# Patient Record
Sex: Male | Born: 1990
Health system: Southern US, Community
[De-identification: ages and names within clinical notes are randomized; demographics above are authoritative.]

## PROBLEM LIST (undated history)

## (undated) DIAGNOSIS — J45909 Unspecified asthma, uncomplicated: Secondary | ICD-10-CM

## (undated) DIAGNOSIS — D689 Coagulation defect, unspecified: Secondary | ICD-10-CM

## (undated) DIAGNOSIS — Z803 Family history of malignant neoplasm of breast: Secondary | ICD-10-CM

## (undated) DIAGNOSIS — D649 Anemia, unspecified: Secondary | ICD-10-CM

## (undated) DIAGNOSIS — T7840XA Allergy, unspecified, initial encounter: Secondary | ICD-10-CM

## (undated) HISTORY — PX: FRACTURE SURGERY: SHX138

## (undated) HISTORY — DX: Family history of malignant neoplasm of breast: Z80.3

## (undated) HISTORY — PX: ORIF FINGER / THUMB FRACTURE: SUR932

## (undated) HISTORY — DX: Allergy, unspecified, initial encounter: T78.40XA

## (undated) HISTORY — PX: COLON SURGERY: SHX602

## (undated) HISTORY — DX: Coagulation defect, unspecified: D68.9

## (undated) HISTORY — PX: VASECTOMY: SHX75

---
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
## 2019-08-15 DIAGNOSIS — M7701 Medial epicondylitis, right elbow: Secondary | ICD-10-CM | POA: Diagnosis not present

## 2019-11-16 ENCOUNTER — Other Ambulatory Visit: Payer: Self-pay

## 2019-11-16 ENCOUNTER — Ambulatory Visit: Payer: BLUE CROSS/BLUE SHIELD | Attending: Internal Medicine

## 2019-11-16 DIAGNOSIS — Z23 Encounter for immunization: Secondary | ICD-10-CM

## 2019-11-26 DIAGNOSIS — Z7189 Other specified counseling: Secondary | ICD-10-CM | POA: Diagnosis not present

## 2019-11-26 DIAGNOSIS — Z20828 Contact with and (suspected) exposure to other viral communicable diseases: Secondary | ICD-10-CM | POA: Diagnosis not present

## 2019-12-10 ENCOUNTER — Ambulatory Visit: Payer: BLUE CROSS/BLUE SHIELD | Attending: Emergency Medicine

## 2019-12-10 DIAGNOSIS — Z23 Encounter for immunization: Secondary | ICD-10-CM

## 2019-12-10 NOTE — Progress Notes (Signed)
Covid-19 Vaccination Clinic  Name:  Nathaniel Pratt    MRN: [id number] DOB: 10/22/1990  12/10/2019  Nathaniel Pratt was observed post Covid-19 immunization for 15 minutes without incident. He was provided with Vaccine Information Sheet and instruction to access the V-Safe system.   Nathaniel Pratt was instructed to call 911 with any severe reactions post vaccine: Marland Kitchen Difficulty breathing  . Swelling of face and throat  . A fast heartbeat  . A bad rash all over body  . Dizziness and weakness   Immunizations Administered    Name Date Dose VIS Date Route   Pfizer COVID-19 Vaccine 12/10/2019 11:38 AM 0.3 mL 08/16/2019 Intramuscular   Manufacturer: [place]   Lot: [id number]   [place]: [id number]

## 2020-12-09 ENCOUNTER — Encounter: Payer: Self-pay | Admitting: Internal Medicine

## 2020-12-09 ENCOUNTER — Ambulatory Visit (INDEPENDENT_AMBULATORY_CARE_PROVIDER_SITE_OTHER): Payer: BC Managed Care – PPO | Admitting: Internal Medicine

## 2020-12-09 ENCOUNTER — Other Ambulatory Visit: Payer: Self-pay

## 2020-12-09 VITALS — BP 124/72 | HR 75 | Temp 98.0°F | Ht 72.0 in | Wt 181.4 lb

## 2020-12-09 DIAGNOSIS — Z0000 Encounter for general adult medical examination without abnormal findings: Secondary | ICD-10-CM | POA: Diagnosis not present

## 2020-12-09 DIAGNOSIS — J3089 Other allergic rhinitis: Secondary | ICD-10-CM | POA: Insufficient documentation

## 2020-12-09 NOTE — Patient Instructions (Signed)
Health Maintenance, Male Adopting a healthy lifestyle and getting preventive care are important in promoting health and wellness. Ask your health care provider about:  The right schedule for you to have regular tests and exams.  Things you can do on your own to prevent diseases and keep yourself healthy. What should I know about diet, weight, and exercise? Eat a healthy diet  Eat a diet that includes plenty of vegetables, fruits, low-fat dairy products, and lean protein.  Do not eat a lot of foods that are high in solid fats, added sugars, or sodium.   Maintain a healthy weight Body mass index (BMI) is a measurement that can be used to identify possible weight problems. It estimates body fat based on height and weight. Your health care provider can help determine your BMI and help you achieve or maintain a healthy weight. Get regular exercise Get regular exercise. This is one of the most important things you can do for your health. Most adults should:  Exercise for at least 150 minutes each week. The exercise should increase your heart rate and make you sweat (moderate-intensity exercise).  Do strengthening exercises at least twice a week. This is in addition to the moderate-intensity exercise.  Spend less time sitting. Even light physical activity can be beneficial. Watch cholesterol and blood lipids Have your blood tested for lipids and cholesterol at 30 years of age, then have this test every 5 years. You may need to have your cholesterol levels checked more often if:  Your lipid or cholesterol levels are high.  You are older than 30 years of age.  You are at high risk for heart disease. What should I know about cancer screening? Many types of cancers can be detected early and may often be prevented. Depending on your health history and family history, you may need to have cancer screening at various ages. This may include screening for:  Colorectal cancer.  Prostate  cancer.  Skin cancer.  Lung cancer. What should I know about heart disease, diabetes, and high blood pressure? Blood pressure and heart disease  High blood pressure causes heart disease and increases the risk of stroke. This is more likely to develop in people who have high blood pressure readings, are of African descent, or are overweight.  Talk with your health care provider about your target blood pressure readings.  Have your blood pressure checked: ? Every 3-5 years if you are 8-53 years of age. ? Every year if you are 55 years old or older.  If you are between the ages of 75 and 45 and are a current or former smoker, ask your health care provider if you should have a one-time screening for abdominal aortic aneurysm (AAA). Diabetes Have regular diabetes screenings. This checks your fasting blood sugar level. Have the screening done:  Once every three years after age 71 if you are at a normal weight and have a low risk for diabetes.  More often and at a younger age if you are overweight or have a high risk for diabetes. What should I know about preventing infection? Hepatitis B If you have a higher risk for hepatitis B, you should be screened for this virus. Talk with your health care provider to find out if you are at risk for hepatitis B infection. Hepatitis C Blood testing is recommended for:  Everyone born from [birth year] through 1965.  Anyone with known risk factors for hepatitis C. Sexually transmitted infections (STIs)  You should be screened each  year for STIs, including gonorrhea and chlamydia, if: ? You are sexually active and are younger than 30 years of age. ? You are older than 30 years of age and your health care provider tells you that you are at risk for this type of infection. ? Your sexual activity has changed since you were last screened, and you are at increased risk for chlamydia or gonorrhea. Ask your health care provider if you are at risk.  Ask your  health care provider about whether you are at high risk for HIV. Your health care provider may recommend a prescription medicine to help prevent HIV infection. If you choose to take medicine to prevent HIV, you should first get tested for HIV. You should then be tested every 3 months for as long as you are taking the medicine. Follow these instructions at home: Lifestyle  Do not use any products that contain nicotine or tobacco, such as cigarettes, e-cigarettes, and chewing tobacco. If you need help quitting, ask your health care provider.  Do not use street drugs.  Do not share needles.  Ask your health care provider for help if you need support or information about quitting drugs. Alcohol use  Do not drink alcohol if your health care provider tells you not to drink.  If you drink alcohol: ? Limit how much you have to 0-2 drinks a day. ? Be aware of how much alcohol is in your drink. In the [place] one drink equals one 12 oz bottle of beer (355 mL), one 5 oz glass of wine (148 mL), or one 1 oz glass of hard liquor (44 mL). General instructions  Schedule regular health, dental, and eye exams.  Stay current with your vaccines.  Tell your health care provider if: ? You often feel depressed. ? You have ever been abused or do not feel safe at home. Summary  Adopting a healthy lifestyle and getting preventive care are important in promoting health and wellness.  Follow your health care provider's instructions about healthy diet, exercising, and getting tested or screened for diseases.  Follow your health care provider's instructions on monitoring your cholesterol and blood pressure. This information is not intended to replace advice given to you by your health care provider. Make sure you discuss any questions you have with your health care provider. Document Revised: 08/15/2018 Document Reviewed: 08/15/2018 Elsevier Patient Education  2021 [employer].

## 2020-12-09 NOTE — Progress Notes (Signed)
Date:  12/09/2020   Name:  Nathaniel Pratt   DOB:  [birth year]   MRN:  [id number]   Chief Complaint: New Patient (Initial Visit) and Establish Care  Aj Crunkleton is a 30 y.o. male who presents today for his Complete Annual Exam. He feels well. He reports exercising -5 days a week weights/with cardio. He reports he is sleeping fairly well. He has allergies in the spring - take Zyrtec and uses an inhaler rarely.  No hx of asthma.  No wheezing with exercise or cold exposure.  Colonoscopy: not due  Immunization History  Administered Date(s) Administered  . PFIZER(Purple Top)SARS-COV-2 Vaccination 11/16/2019, 12/10/2019    HPI  No results found for: CREATININE, BUN, NA, K, CL, CO2 No results found for: CHOL, HDL, LDLCALC, LDLDIRECT, TRIG, CHOLHDL No results found for: TSH No results found for: HGBA1C No results found for: WBC, HGB, HCT, MCV, PLT No results found for: ALT, AST, GGT, ALKPHOS, BILITOT   Review of Systems  Constitutional: Negative for appetite change, chills, diaphoresis, fatigue and unexpected weight change.  HENT: Negative for hearing loss, tinnitus, trouble swallowing and voice change.   Eyes: Negative for visual disturbance.  Respiratory: Negative for choking, shortness of breath and wheezing.   Cardiovascular: Negative for chest pain, palpitations and leg swelling.  Gastrointestinal: Negative for abdominal pain, blood in stool, constipation and diarrhea.  Genitourinary: Negative for difficulty urinating, dysuria and frequency.  Musculoskeletal: Positive for back pain (intermittent left sided sciatica). Negative for arthralgias and myalgias.  Skin: Negative for color change and rash.  Neurological: Negative for dizziness, syncope and headaches.  Hematological: Negative for adenopathy.  Psychiatric/Behavioral: Negative for dysphoric mood and sleep disturbance.    Patient Active Problem List   Diagnosis Date Noted  . Environmental and seasonal allergies  12/09/2020    Allergies  Allergen Reactions  . Amoxicillin Hives  . Penicillins Hives and Itching    Past Surgical History:  Procedure Laterality Date  . ORIF FINGER / THUMB FRACTURE    . RHINOPLASTY  2008    Social History   Tobacco Use  . Smoking status: Never Smoker  . Smokeless tobacco: Never Used  Vaping Use  . Vaping Use: Never used  Substance Use Topics  . Alcohol use: Not Currently  . Drug use: Never     Medication list has been reviewed and updated.  Current Meds  Medication Sig  . Albuterol Sulfate (VENTOLIN HFA IN) Inhale 1 Inhaler into the lungs as needed.  . cetirizine (ZYRTEC) 10 MG tablet Take 10 mg by mouth daily.    PHQ 2/9 Scores 12/09/2020  PHQ - 2 Score 0  PHQ- 9 Score 0    GAD 7 : Generalized Anxiety Score 12/09/2020  Nervous, Anxious, on Edge 0  Control/stop worrying 0  Worry too much - different things 0  Trouble relaxing 0  Restless 0  Easily annoyed or irritable 0  Afraid - awful might happen 0  Total GAD 7 Score 0    BP Readings from Last 3 Encounters:  12/09/20 124/72    Physical Exam Vitals and nursing note reviewed.  Constitutional:      Appearance: Normal appearance. He is well-developed.  HENT:     Head: Normocephalic.     Right Ear: Tympanic membrane, ear canal and external ear normal.     Left Ear: Tympanic membrane, ear canal and external ear normal.     Nose: Nose normal.  Eyes:     Conjunctiva/sclera: Conjunctivae  normal.     Pupils: Pupils are equal, round, and reactive to light.  Neck:     Thyroid: No thyromegaly.     Vascular: No carotid bruit.  Cardiovascular:     Rate and Rhythm: Normal rate and regular rhythm.     Pulses: Normal pulses.     Heart sounds: Normal heart sounds.  Pulmonary:     Effort: Pulmonary effort is normal.     Breath sounds: Normal breath sounds. No wheezing.  Chest:  Breasts:     Right: No mass.     Left: No mass.    Abdominal:     General: Abdomen is flat. Bowel sounds are  normal.     Palpations: Abdomen is soft. There is no mass.     Tenderness: There is no abdominal tenderness. There is no guarding or rebound.  Musculoskeletal:        General: Normal range of motion.     Cervical back: Normal range of motion and neck supple.     Right lower leg: No edema.     Left lower leg: No edema.  Lymphadenopathy:     Cervical: No cervical adenopathy.  Skin:    General: Skin is warm and dry.     Capillary Refill: Capillary refill takes less than 2 seconds.  Neurological:     General: No focal deficit present.     Mental Status: He is alert and oriented to person, place, and time.     Deep Tendon Reflexes: Reflexes are normal and symmetric.  Psychiatric:        Attention and Perception: Attention normal.        Mood and Affect: Mood normal.        Thought Content: Thought content normal.     Wt Readings from Last 3 Encounters:  12/09/20 181 lb 6.4 oz (82.3 kg)    BP 124/72 (BP Location: Right Arm, Patient Position: Sitting, Cuff Size: Normal)   Pulse 75   Temp 98 F (36.7 C) (Oral)   Ht 6' (1.829 m)   Wt 181 lb 6.4 oz (82.3 kg)   SpO2 96%   BMI 24.60 kg/m   Assessment and Plan: 1. Annual physical exam Normal exam Continue regular exercise and healthy diet Consider Tdap booster - CBC with Differential/Platelet - Comprehensive metabolic panel - Lipid panel  2. Environmental and seasonal allergies Controlled on Zyrtec daily Continue to use albuterol PRN   Partially dictated using Editor, commissioning. Any errors are unintentional.  Halina Maidens, MD [facility]  12/09/2020

## 2020-12-10 LAB — CBC WITH DIFFERENTIAL/PLATELET
Basophils Absolute: 0.1 10*3/uL (ref 0.0–0.2)
Basos: 1 %
EOS (ABSOLUTE): 0.2 10*3/uL (ref 0.0–0.4)
Eos: 3 %
Hematocrit: 36.4 % — ABNORMAL LOW (ref 37.5–51.0)
Hemoglobin: 11.7 g/dL — ABNORMAL LOW (ref 13.0–17.7)
Immature Grans (Abs): 0 10*3/uL (ref 0.0–0.1)
Immature Granulocytes: 0 %
Lymphocytes Absolute: 1.9 10*3/uL (ref 0.7–3.1)
Lymphs: 31 %
MCH: 25.7 pg — ABNORMAL LOW (ref 26.6–33.0)
MCHC: 32.1 g/dL (ref 31.5–35.7)
MCV: 80 fL (ref 79–97)
Monocytes Absolute: 0.5 10*3/uL (ref 0.1–0.9)
Monocytes: 8 %
Neutrophils Absolute: 3.5 10*3/uL (ref 1.4–7.0)
Neutrophils: 57 %
Platelets: 283 10*3/uL (ref 150–450)
RBC: 4.55 x10E6/uL (ref 4.14–5.80)
RDW: 14.4 % (ref 11.6–15.4)
WBC: 6.2 10*3/uL (ref 3.4–10.8)

## 2020-12-10 LAB — COMPREHENSIVE METABOLIC PANEL
ALT: 15 IU/L (ref 0–44)
AST: 21 IU/L (ref 0–40)
Albumin/Globulin Ratio: 1.9 (ref 1.2–2.2)
Albumin: 4.5 g/dL (ref 4.1–5.2)
Alkaline Phosphatase: 64 IU/L (ref 44–121)
BUN/Creatinine Ratio: 24 — ABNORMAL HIGH (ref 9–20)
BUN: 23 mg/dL — ABNORMAL HIGH (ref 6–20)
Bilirubin Total: <0.2 mg/dL (ref 0.0–1.2)
CO2: 23 mmol/L (ref 20–29)
Calcium: 9.3 mg/dL (ref 8.7–10.2)
Chloride: 102 mmol/L (ref 96–106)
Creatinine, Ser: 0.97 mg/dL (ref 0.76–1.27)
Globulin, Total: 2.4 g/dL (ref 1.5–4.5)
Glucose: 83 mg/dL (ref 65–99)
Potassium: 4.3 mmol/L (ref 3.5–5.2)
Sodium: 143 mmol/L (ref 134–144)
Total Protein: 6.9 g/dL (ref 6.0–8.5)
eGFR: 108 mL/min/{1.73_m2} (ref >59)

## 2020-12-10 LAB — LIPID PANEL
Chol/HDL Ratio: 2.5 ratio (ref 0.0–5.0)
Cholesterol, Total: 119 mg/dL (ref 100–199)
HDL: 47 mg/dL (ref >39)
LDL Chol Calc (NIH): 55 mg/dL (ref 0–99)
Triglycerides: 84 mg/dL (ref 0–149)
VLDL Cholesterol Cal: 17 mg/dL (ref 5–40)

## 2020-12-10 NOTE — Progress Notes (Signed)
Called pt could not leave VM. Phone was busy.  Will route this note to [facility] Nurse just in case the patient returns call to clinic. [facility] Nurse may give patient results if pt returns the call.  CRM has also been created for this msg for call center purposes. Please attach any notes regarding this lab to this result message and do not create a new CRM.   KP

## 2020-12-11 NOTE — Progress Notes (Signed)
Review.

## 2020-12-12 LAB — FERRITIN: Ferritin: 6 ng/mL — ABNORMAL LOW (ref 30–400)

## 2020-12-12 LAB — SPECIMEN STATUS REPORT

## 2020-12-12 LAB — IRON AND TIBC
Iron Saturation: 6 % — CL (ref 15–55)
Iron: 23 ug/dL — ABNORMAL LOW (ref 38–169)
Total Iron Binding Capacity: 391 ug/dL (ref 250–450)
UIBC: 368 ug/dL — ABNORMAL HIGH (ref 111–343)

## 2021-01-08 ENCOUNTER — Other Ambulatory Visit: Payer: BC Managed Care – PPO

## 2021-01-08 ENCOUNTER — Other Ambulatory Visit: Payer: Self-pay

## 2021-01-08 DIAGNOSIS — D649 Anemia, unspecified: Secondary | ICD-10-CM

## 2021-01-09 LAB — CBC WITH DIFFERENTIAL/PLATELET
Basophils Absolute: 0 10*3/uL (ref 0.0–0.2)
Basos: 1 %
EOS (ABSOLUTE): 0.2 10*3/uL (ref 0.0–0.4)
Eos: 3 %
Hematocrit: 39.2 % (ref 37.5–51.0)
Hemoglobin: 12.9 g/dL — ABNORMAL LOW (ref 13.0–17.7)
Immature Grans (Abs): 0 10*3/uL (ref 0.0–0.1)
Immature Granulocytes: 0 %
Lymphocytes Absolute: 2 10*3/uL (ref 0.7–3.1)
Lymphs: 39 %
MCH: 26.7 pg (ref 26.6–33.0)
MCHC: 32.9 g/dL (ref 31.5–35.7)
MCV: 81 fL (ref 79–97)
Monocytes Absolute: 0.5 10*3/uL (ref 0.1–0.9)
Monocytes: 10 %
Neutrophils Absolute: 2.3 10*3/uL (ref 1.4–7.0)
Neutrophils: 47 %
Platelets: 268 10*3/uL (ref 150–450)
RBC: 4.83 x10E6/uL (ref 4.14–5.80)
RDW: 16.8 % — ABNORMAL HIGH (ref 11.6–15.4)
WBC: 5 10*3/uL (ref 3.4–10.8)

## 2021-01-09 LAB — IRON AND TIBC
Iron Saturation: >95 % (ref 15–55)
Iron: 342 ug/dL (ref 38–169)
Total Iron Binding Capacity: <359 ug/dL (ref 250–450)
UIBC: <17 ug/dL — ABNORMAL LOW (ref 111–343)

## 2021-01-13 LAB — FECAL OCCULT BLOOD, IMMUNOCHEMICAL: Fecal Occult Bld: POSITIVE — AB

## 2021-01-14 ENCOUNTER — Other Ambulatory Visit: Payer: Self-pay

## 2021-01-14 ENCOUNTER — Telehealth: Payer: Self-pay

## 2021-01-14 DIAGNOSIS — D649 Anemia, unspecified: Secondary | ICD-10-CM

## 2021-01-14 DIAGNOSIS — R195 Other fecal abnormalities: Secondary | ICD-10-CM

## 2021-01-14 NOTE — Telephone Encounter (Signed)
Placed referral for GI this morning. Referral coordinator messaged me saying [facility] has called patient 3 times this morning trying to schedule him for a consultation and he did not answer the phone. Tried calling patient myself. He did not answer either. Left VM with [facility]'s number- so he can call and schedule his appt as soon as possible.

## 2021-02-04 DIAGNOSIS — D229 Melanocytic nevi, unspecified: Secondary | ICD-10-CM | POA: Diagnosis not present

## 2021-02-04 DIAGNOSIS — D2339 Other benign neoplasm of skin of other parts of face: Secondary | ICD-10-CM | POA: Diagnosis not present

## 2021-03-16 ENCOUNTER — Ambulatory Visit: Payer: BC Managed Care – PPO | Admitting: Gastroenterology

## 2021-03-23 ENCOUNTER — Ambulatory Visit (INDEPENDENT_AMBULATORY_CARE_PROVIDER_SITE_OTHER): Payer: BC Managed Care – PPO | Admitting: Gastroenterology

## 2021-03-23 ENCOUNTER — Other Ambulatory Visit: Payer: Self-pay

## 2021-03-23 ENCOUNTER — Encounter: Payer: Self-pay | Admitting: Gastroenterology

## 2021-03-23 VITALS — BP 106/72 | HR 101 | Temp 97.4°F | Ht 72.0 in | Wt 186.6 lb

## 2021-03-23 DIAGNOSIS — K625 Hemorrhage of anus and rectum: Secondary | ICD-10-CM

## 2021-03-23 DIAGNOSIS — D509 Iron deficiency anemia, unspecified: Secondary | ICD-10-CM

## 2021-03-23 DIAGNOSIS — Z791 Long term (current) use of non-steroidal anti-inflammatories (NSAID): Secondary | ICD-10-CM | POA: Diagnosis not present

## 2021-03-23 MED ORDER — NA SULFATE-K SULFATE-MG SULF 17.5-3.13-1.6 GM/177ML PO SOLN: 354.0000 mL | Freq: Once | ORAL | 0 refills | Status: AC

## 2021-03-23 NOTE — Progress Notes (Signed)
Nathaniel Bellows MD, MRCP(U.K) [address]  [facility]  [place], [place] [address]  Main: [phone]  Fax: [phone]   Gastroenterology Consultation  Referring Provider:     Glean Hess, MD Primary Care Physician:  Glean Hess, MD Primary Gastroenterologist:  Dr. Jonathon Pratt  Reason for Consultation:     Anemia         HPI:   Nathaniel Pratt is a 30 y.o. y/o male referred for consultation & management  by Dr. Army Melia, Jesse Sans, MD.   Recently had labs checked and was found to have iron deficiency anemia.  He has noticed blood in the stool on and off for over 7 years.  Did discuss with his physician in 2015 but did not have any work-up.  Blood is usually on the tissue paper after bowel movement.  No change in the shape of stool.  No unintentional weight loss.  No family history of colon cancer.  No prior GI evaluation.  Denies any blood in the urine blood from nasal bleeds, hemoptysis or hematemesis.  Denies any abdominal pain.  Has been taking about 2 Excedrin's a day for many years for headaches.  CBC Latest Ref Rng & Units 01/08/2021 12/09/2020  WBC 3.4 - 10.8 x10E3/uL 5.0 6.2  Hemoglobin 13.0 - 17.7 g/dL 12.9(L) 11.7(L)  Hematocrit 37.5 - 51.0 % 39.2 36.4(L)  Platelets 150 - 450 x10E3/uL 268 283  Iron/TIBC/Ferritin/ %Sat    Component Value Date/Time   IRON 342 (HH) 01/08/2021 1034   TIBC <359 01/08/2021 1034   FERRITIN 6 (L) 12/09/2020 1546   IRONPCTSAT >95 (HH) 01/08/2021 1034      Past Medical History:  Diagnosis Date   Allergy    Broken nose 2008   Thumb fracture 02/2009    Past Surgical History:  Procedure Laterality Date   ORIF FINGER / [place]  2008    Prior to Admission medications   Medication Sig Start Date End Date Taking? Authorizing Provider  Albuterol Sulfate (VENTOLIN HFA IN) Inhale 1 Inhaler into the lungs as needed. 11/23/18   [provider]  cetirizine (ZYRTEC) 10 MG tablet Take 10 mg by mouth  daily. 12/04/20   [provider]    Family History  Problem Relation Age of Onset   Hypertension Mother    Allergies Mother    Hypertension Father    Bleeding Disorder Maternal Grandmother    Heart attack Maternal Grandfather    Breast cancer Paternal Grandmother    Clotting disorder Paternal Grandfather      Social History   Tobacco Use   Smoking status: Never   Smokeless tobacco: Never  Vaping Use   Vaping Use: Never used  Substance Use Topics   Alcohol use: Not Currently   Drug use: Never    Allergies as of 03/23/2021 - Review Complete 03/23/2021  Allergen Reaction Noted   Amoxicillin Hives 12/09/2020   Penicillins Hives and Itching 12/09/2020    Review of Systems:    All systems reviewed and negative except where noted in HPI.   Physical Exam:  BP 106/72   Pulse (!) 101   Temp (!) 97.4 F (36.3 C) (Oral)   Ht 6' (1.829 m)   Wt 186 lb 9.6 oz (84.6 kg)   BMI 25.31 kg/m  No LMP for male patient. Psych:  Alert and cooperative. Normal mood and affect. General:   Alert,  Well-developed, well-nourished, pleasant and cooperative in NAD  Head:  Normocephalic and atraumatic. Eyes:  Sclera clear, no icterus.   Conjunctiva pink. Ears:  Normal auditory acuity. Lungs:  Respirations even and unlabored.  Clear throughout to auscultation.   No wheezes, crackles, or rhonchi. No acute distress. Heart:  Regular rate and rhythm; no murmurs, clicks, rubs, or gallops. Abdomen:  Normal bowel sounds.  No bruits.  Soft, non-tender and non-distended without masses, hepatosplenomegaly or hernias noted.  No guarding or rebound tenderness.    Neurologic:  Alert and oriented x3;  grossly normal neurologically. Psych:  Alert and cooperative. Normal mood and affect.  Imaging Studies: No results found.  Assessment and Plan:   Nathaniel Pratt is a 30 y.o. y/o male has been referred for iron deficiency anemia.  Has some rectal bleeding probably related to hemorrhoids he has been  taking NSAIDs for many years which is very likely the reason for iron deficiency anemia.  Plan  Check iron studies, b12,folate, celiac serology and urine .  EGD+colonoscopy and if negative may need capsule study of small bowel  3.    Stop all NSAID use  I have discussed alternative options, risks & benefits,  which include, but are not limited to, bleeding, infection, perforation,respiratory complication & drug reaction.  The patient agrees with this plan & written consent will be obtained.    Follow up in 8-12 weeks   Dr Nathaniel Bellows MD,MRCP(U.K)

## 2021-03-30 LAB — URINALYSIS
Bilirubin, UA: NEGATIVE
Glucose, UA: NEGATIVE
Leukocytes,UA: NEGATIVE
Nitrite, UA: NEGATIVE
RBC, UA: NEGATIVE
Specific Gravity, UA: >=1.03 — AB (ref 1.005–1.030)
Urobilinogen, Ur: 0.2 mg/dL (ref 0.2–1.0)
pH, UA: 5.5 (ref 5.0–7.5)

## 2021-03-30 LAB — CELIAC DISEASE AB SCREEN W/RFX
Antigliadin Abs, IgA: 4 units (ref 0–19)
IgA/Immunoglobulin A, Serum: 165 mg/dL (ref 90–386)
Transglutaminase IgA: 2 U/mL (ref 0–3)

## 2021-03-30 LAB — B12 AND FOLATE PANEL
Folate: 7.9 ng/mL (ref >3.0)
Vitamin B-12: 486 pg/mL (ref 232–1245)

## 2021-03-30 LAB — H. PYLORI BREATH TEST: H pylori Breath Test: NEGATIVE

## 2021-04-07 ENCOUNTER — Ambulatory Visit: Payer: BC Managed Care – PPO | Admitting: Anesthesiology

## 2021-04-07 ENCOUNTER — Ambulatory Visit
Admission: RE | Admit: 2021-04-07 | Discharge: 2021-04-07 | Disposition: A | Discharge status: Home / Self Care | Payer: BC Managed Care – PPO | Source: Ambulatory Visit | Attending: Gastroenterology | Admitting: Gastroenterology

## 2021-04-07 ENCOUNTER — Encounter
Admission: RE | Disposition: A | Discharge status: Home / Self Care | Payer: Self-pay | Source: Ambulatory Visit | Attending: Gastroenterology

## 2021-04-07 DIAGNOSIS — Z79899 Other long term (current) drug therapy: Secondary | ICD-10-CM | POA: Insufficient documentation

## 2021-04-07 DIAGNOSIS — D490 Neoplasm of unspecified behavior of digestive system: Secondary | ICD-10-CM | POA: Insufficient documentation

## 2021-04-07 DIAGNOSIS — Z832 Family history of diseases of the blood and blood-forming organs and certain disorders involving the immune mechanism: Secondary | ICD-10-CM | POA: Diagnosis not present

## 2021-04-07 DIAGNOSIS — D509 Iron deficiency anemia, unspecified: Secondary | ICD-10-CM | POA: Insufficient documentation

## 2021-04-07 DIAGNOSIS — Z803 Family history of malignant neoplasm of breast: Secondary | ICD-10-CM | POA: Diagnosis not present

## 2021-04-07 DIAGNOSIS — Z880 Allergy status to penicillin: Secondary | ICD-10-CM | POA: Insufficient documentation

## 2021-04-07 DIAGNOSIS — K6289 Other specified diseases of anus and rectum: Secondary | ICD-10-CM | POA: Diagnosis not present

## 2021-04-07 DIAGNOSIS — K625 Hemorrhage of anus and rectum: Secondary | ICD-10-CM

## 2021-04-07 DIAGNOSIS — Z8249 Family history of ischemic heart disease and other diseases of the circulatory system: Secondary | ICD-10-CM | POA: Diagnosis not present

## 2021-04-07 HISTORY — PX: COLONOSCOPY WITH PROPOFOL: SHX5780

## 2021-04-07 HISTORY — PX: ESOPHAGOGASTRODUODENOSCOPY (EGD) WITH PROPOFOL: SHX5813

## 2021-04-07 SURGERY — ESOPHAGOGASTRODUODENOSCOPY (EGD) WITH PROPOFOL
Anesthesia: General

## 2021-04-07 MED ORDER — PROPOFOL 10 MG/ML IV BOLUS
INTRAVENOUS | Status: DC | PRN
  Administered 2021-04-07: 30 mg via INTRAVENOUS
  Administered 2021-04-07: 50 mg via INTRAVENOUS
  Administered 2021-04-07: 80 mg via INTRAVENOUS

## 2021-04-07 MED ORDER — LIDOCAINE HCL (CARDIAC) PF 100 MG/5ML IV SOSY
PREFILLED_SYRINGE | INTRAVENOUS | Status: DC | PRN
  Administered 2021-04-07: 100 mg via INTRAVENOUS

## 2021-04-07 MED ORDER — FENTANYL CITRATE (PF) 100 MCG/2ML IJ SOLN
INTRAMUSCULAR | Status: AC
  Filled 2021-04-07: qty 2

## 2021-04-07 MED ORDER — SODIUM CHLORIDE 0.9 % IV SOLN
INTRAVENOUS | Status: DC
  Administered 2021-04-07: 20 mL/h via INTRAVENOUS

## 2021-04-07 MED ORDER — PROPOFOL 500 MG/50ML IV EMUL
INTRAVENOUS | Status: DC | PRN
  Administered 2021-04-07: 150 ug/kg/min via INTRAVENOUS

## 2021-04-07 MED ORDER — PROPOFOL 500 MG/50ML IV EMUL
INTRAVENOUS | Status: AC
  Filled 2021-04-07: qty 50

## 2021-04-07 NOTE — H&P (Signed)
Jonathon Bellows, MD [address], [place], [place], [place], [address] [address], [place], [place], [place], [address] Phone: [phone]  Fax: [phone]  Primary Care Physician:  Glean Hess, MD   Pre-Procedure History & Physical: HPI:  Nathaniel Pratt is a 30 y.o. male is here for an endoscopy and colonoscopy    Past Medical History:  Diagnosis Date   Allergy    Broken nose 2008   Thumb fracture 02/2009    Past Surgical History:  Procedure Laterality Date   ORIF FINGER / THUMB FRACTURE     RHINOPLASTY  2008    Prior to Admission medications   Medication Sig Start Date End Date Taking? Authorizing Provider  aspirin-acetaminophen-caffeine (EXCEDRIN MIGRAINE) [phone] MG tablet Take by mouth every 6 (six) hours as needed for headache.   Yes [provider]  cetirizine (ZYRTEC) 10 MG tablet Take 10 mg by mouth daily. 12/04/20  Yes [provider]  ibuprofen (ADVIL) 200 MG tablet Take 200 mg by mouth every 6 (six) hours as needed.   Yes [provider]  Albuterol Sulfate (VENTOLIN HFA IN) Inhale 1 Inhaler into the lungs as needed. Patient not taking: Reported on 03/23/2021 11/23/18   [provider]    Allergies as of 03/23/2021 - Review Complete 03/23/2021  Allergen Reaction Noted   Amoxicillin Hives 12/09/2020   Penicillins Hives and Itching 12/09/2020    Family History  Problem Relation Age of Onset   Hypertension Mother    Allergies Mother    Hypertension Father    Bleeding Disorder Maternal Grandmother    Heart attack Maternal Grandfather    Breast cancer Paternal Grandmother    Clotting disorder Paternal Grandfather     Social History   Socioeconomic History   Marital status: Married    Spouse name: Undrea Shipes   Number of children: 1   Years of education: 16   Highest education level: Bachelor's degree (e.g., BA, AB, BS)  Occupational History   Not on file  Tobacco Use   Smoking status: Never    Smokeless tobacco: Never  Vaping Use   Vaping Use: Never used  Substance and Sexual Activity   Alcohol use: Not Currently   Drug use: Never   Sexual activity: Yes  Other Topics Concern   Not on file  Social History Narrative   Not on file   Social Determinants of Health   Financial Resource Strain: Not on file  Food Insecurity: Not on file  Transportation Needs: Not on file  Physical Activity: Not on file  Stress: Not on file  Social Connections: Not on file  Intimate Partner Violence: Not on file    Review of Systems: See HPI, otherwise negative ROS  Physical Exam: BP 136/72   Pulse 78   Temp (!) 97.2 F (36.2 C) (Temporal)   Resp 20   Ht 6' (1.829 m)   Wt 81.6 kg   SpO2 100%   BMI 24.41 kg/m  General:   Alert,  pleasant and cooperative in NAD Head:  Normocephalic and atraumatic. Neck:  Supple; no masses or thyromegaly. Lungs:  Clear throughout to auscultation, normal respiratory effort.    Heart:  +S1, +S2, Regular rate and rhythm, No edema. Abdomen:  Soft, nontender and nondistended. Normal bowel sounds, without guarding, and without rebound.   Neurologic:  Alert and  oriented x4;  grossly normal neurologically.  Impression/Plan: Nathaniel Pratt is here for an endoscopy and colonoscopy  to  be performed for  evaluation of iron deficiency anemia     Risks, benefits, limitations, and alternatives regarding endoscopy have been reviewed with the patient.  Questions have been answered.  All parties agreeable.   Jonathon Bellows, MD  04/07/2021, 11:33 AM iro

## 2021-04-07 NOTE — Op Note (Signed)
[facility]astroenter[facility] Patient Name: Nathaniel Pratt Procedure Date: 04/07/2021 11:32 AM MRN: [id number] Account #: [id number] Date of Birth: Jan 31, 1991 Admit Type: Outpatient Age: 30 Room: [facility] ENDO ROOM 1 Gender: Male Note Status: Finalized Procedure:             Upper GI endoscopy Indications:           Iron deficiency anemia Providers:             Jonathon Bellows MD, MD Medicines:             Monitored Anesthesia Care Complications:         No immediate complications. Procedure:             Pre-Anesthesia Assessment:                        - Prior to the procedure, a History and Physical was                         performed, and patient medications, allergies and                         sensitivities were reviewed. The patient's tolerance                         of previous anesthesia was reviewed.                        - The risks and benefits of the procedure and the                         sedation options and risks were discussed with the                         patient. All questions were answered and informed                         consent was obtained.                        - ASA Grade Assessment: II - A patient with mild                         systemic disease.                        After obtaining informed consent, the endoscope was                         passed under direct vision. Throughout the procedure,                         the patient's blood pressure, pulse, and oxygen                         saturations were monitored continuously. The                         Endosonoscope was introduced through the mouth, and  advanced to the third part of duodenum. The upper GI                         endoscopy was accomplished with ease. The patient                         tolerated the procedure well. Findings:      The esophagus was normal.      The stomach was normal.      The examined duodenum was normal. Impression:             - Normal esophagus.                        - Normal stomach.                        - Normal examined duodenum.                        - No specimens collected. Recommendation:        - Perform a colonoscopy today. Procedure Code(s):     --- Professional ---                        [phone], Esophagogastroduodenoscopy, flexible,                         transoral; diagnostic, including collection of                         specimen(s) by brushing or washing, when performed                         (separate procedure) Diagnosis Code(s):     --- Professional ---                        D50.9, Iron deficiency anemia, unspecified CPT copyright 2019 American Medical Association. All rights reserved. The codes documented in this report are preliminary and upon coder review may  be revised to meet current compliance requirements. Jonathon Bellows, MD Jonathon Bellows MD, MD 04/07/2021 11:42:31 AM This report has been signed electronically. Number of Addenda: 0 Note Initiated On: 04/07/2021 11:32 AM Estimated Blood Loss:  Estimated blood loss: none.      [facility]

## 2021-04-07 NOTE — Op Note (Signed)
[facility] Gastroenterology Patient Name: Nathaniel Pratt Procedure Date: 04/07/2021 11:42 AM MRN: [id number] Account #: [id number] Date of Birth: [birth year] Admit Type: Outpatient Age: 30 Room: [facility] ENDO ROOM 1 Gender: Male Note Status: Finalized Procedure:             Colonoscopy Indications:           Iron deficiency anemia Providers:             Jonathon Bellows MD, MD Medicines:             Monitored Anesthesia Care Complications:         No immediate complications. Procedure:             Pre-Anesthesia Assessment:                        - Prior to the procedure, a History and Physical was                         performed, and patient medications, allergies and                         sensitivities were reviewed. The patient's tolerance                         of previous anesthesia was reviewed.                        - The risks and benefits of the procedure and the                         sedation options and risks were discussed with the                         patient. All questions were answered and informed                         consent was obtained.                        - ASA Grade Assessment: II - A patient with mild                         systemic disease.                        After obtaining informed consent, the colonoscope was                         passed under direct vision. Throughout the procedure,                         the patient's blood pressure, pulse, and oxygen                         saturations were monitored continuously. The                         Colonoscope was introduced through the anus and  advanced to the the cecum, identified by the                         appendiceal orifice. The colonoscopy was performed                         with ease. The patient tolerated the procedure well.                         The quality of the bowel preparation was excellent. Findings:      The perianal and digital  rectal examinations were normal.      A polypoid non-obstructing large mass was found in the recto-sigmoid       colon. The mass was partially circumferential (involving two-thirds of       the lumen circumference). The mass measured two cm in length. In       addition, its diameter measured thirty mm. No bleeding was present.      The exam was otherwise without abnormality on direct and retroflexion       views. Impression:            - Tumor in the recto-sigmoid colon.                        - The examination was otherwise normal on direct and                         retroflexion views.                        - No specimens collected. Recommendation:        - Discharge patient to home (with escort).                        - Resume previous diet.                        - Continue present medications.                        - Perform a lower endoscopic ultrasound (LEUS) in 4                         weeks.                        - The rectal mass had normal overlying mucosa,                         differentials include a large lipoma Procedure Code(s):     --- Professional ---                        [phone], Colonoscopy, flexible; diagnostic, including                         collection of specimen(s) by brushing or washing, when                         performed (separate procedure) Diagnosis Code(s):     --- Professional ---  D49.0, Neoplasm of unspecified behavior of digestive                         system                        D50.9, Iron deficiency anemia, unspecified CPT copyright 2019 American Medical Association. All rights reserved. The codes documented in this report are preliminary and upon coder review may  be revised to meet current compliance requirements. Jonathon Bellows, MD Jonathon Bellows MD, MD 04/07/2021 12:00:54 PM This report has been signed electronically. Number of Addenda: 0 Note Initiated On: 04/07/2021 11:42 AM Scope Withdrawal Time: 0 hours 10  minutes 32 seconds  Total Procedure Duration: 0 hours 12 minutes 43 seconds  Estimated Blood Loss:  Estimated blood loss: none.      [facility]

## 2021-04-07 NOTE — Anesthesia Preprocedure Evaluation (Signed)
Anesthesia Evaluation  Patient identified by MRN, date of birth, ID band Patient awake    Reviewed: Allergy & Precautions, H&P , NPO status , Patient's Chart, lab work & pertinent test results, reviewed documented beta blocker date and time   Airway Mallampati: II   Neck ROM: full    Dental  (+) Poor Dentition   Pulmonary neg pulmonary ROS,    Pulmonary exam normal        Cardiovascular negative cardio ROS Normal cardiovascular exam Rhythm:regular Rate:Normal     Neuro/Psych negative neurological ROS  negative psych ROS   GI/Hepatic negative GI ROS, Neg liver ROS,   Endo/Other  negative endocrine ROS  Renal/GU negative Renal ROS  negative genitourinary   Musculoskeletal   Abdominal   Peds  Hematology negative hematology ROS (+)   Anesthesia Other Findings Past Medical History: No date: Allergy 2008: Broken nose 02/2009: Thumb fracture Past Surgical History: No date: ORIF FINGER / THUMB FRACTURE 2008: RHINOPLASTY   Reproductive/Obstetrics negative OB ROS                             Anesthesia Physical Anesthesia Plan  ASA: 2  Anesthesia Plan: General   Post-op Pain Management:    Induction:   PONV Risk Score and Plan:   Airway Management Planned:   Additional Equipment:   Intra-op Plan:   Post-operative Plan:   Informed Consent: I have reviewed the patients History and Physical, chart, labs and discussed the procedure including the risks, benefits and alternatives for the proposed anesthesia with the patient or authorized representative who has indicated his/her understanding and acceptance.     Dental Advisory Given  Plan Discussed with: CRNA  Anesthesia Plan Comments:         Anesthesia Quick Evaluation

## 2021-04-07 NOTE — Transfer of Care (Signed)
Immediate Anesthesia Transfer of Care Note  Patient: Nathaniel Pratt  Procedure(s) Performed: ESOPHAGOGASTRODUODENOSCOPY (EGD) WITH PROPOFOL COLONOSCOPY WITH PROPOFOL  Patient Location: PACU  Anesthesia Type:General  Level of Consciousness: sedated  Airway & Oxygen Therapy: Patient Spontanous Breathing  Post-op Assessment: Report given to RN and Post -op Vital signs reviewed and stable  Post vital signs: Reviewed and stable  Last Vitals:  Vitals Value Taken Time  BP    Temp    Pulse    Resp    SpO2      Last Pain:  Vitals:   04/07/21 1033  TempSrc: Temporal  PainSc: 0-No pain         Complications: No notable events documented.

## 2021-04-07 NOTE — H&P (View-Only) (Signed)
Jonathon Bellows, MD [address] [place], [place], [place], [address] [address], [place], [place], [place], [address] Phone: [phone]  Fax: [phone]  Primary Care Physician:  Glean Hess, MD   Pre-Procedure History & Physical: HPI:  Nathaniel Pratt is a 30 y.o. male is here for an endoscopy and colonoscopy    Past Medical History:  Diagnosis Date   Allergy    Broken nose 2008   Thumb fracture 02/2009    Past Surgical History:  Procedure Laterality Date   ORIF FINGER / THUMB FRACTURE     RHINOPLASTY  2008    Prior to Admission medications   Medication Sig Start Date End Date Taking? Authorizing Provider  aspirin-acetaminophen-caffeine (EXCEDRIN MIGRAINE) [phone] MG tablet Take by mouth every 6 (six) hours as needed for headache.   Yes [provider]  cetirizine (ZYRTEC) 10 MG tablet Take 10 mg by mouth daily. 12/04/20  Yes [provider]  ibuprofen (ADVIL) 200 MG tablet Take 200 mg by mouth every 6 (six) hours as needed.   Yes [provider]  Albuterol Sulfate (VENTOLIN HFA IN) Inhale 1 Inhaler into the lungs as needed. Patient not taking: Reported on 03/23/2021 11/23/18   [provider]    Allergies as of 03/23/2021 - Review Complete 03/23/2021  Allergen Reaction Noted   Amoxicillin Hives 12/09/2020   Penicillins Hives and Itching 12/09/2020    Family History  Problem Relation Age of Onset   Hypertension Mother    Allergies Mother    Hypertension Father    Bleeding Disorder Maternal Grandmother    Heart attack Maternal Grandfather    Breast cancer Paternal Grandmother    Clotting disorder Paternal Grandfather     Social History   Socioeconomic History   Marital status: Married    Spouse name: Amine Adelson   Number of children: 1   Years of education: 16   Highest education level: Bachelor's degree (e.g., BA, AB, BS)  Occupational History   Not on file  Tobacco Use   Smoking status: Never    Smokeless tobacco: Never  Vaping Use   Vaping Use: Never used  Substance and Sexual Activity   Alcohol use: Not Currently   Drug use: Never   Sexual activity: Yes  Other Topics Concern   Not on file  Social History Narrative   Not on file   Social Determinants of Health   Financial Resource Strain: Not on file  Food Insecurity: Not on file  Transportation Needs: Not on file  Physical Activity: Not on file  Stress: Not on file  Social Connections: Not on file  Intimate Partner Violence: Not on file    Review of Systems: See HPI, otherwise negative ROS  Physical Exam: BP 136/72   Pulse 78   Temp (!) 97.2 F (36.2 C) (Temporal)   Resp 20   Ht 6' (1.829 m)   Wt 81.6 kg   SpO2 100%   BMI 24.41 kg/m  General:   Alert,  pleasant and cooperative in NAD Head:  Normocephalic and atraumatic. Neck:  Supple; no masses or thyromegaly. Lungs:  Clear throughout to auscultation, normal respiratory effort.    Heart:  +S1, +S2, Regular rate and rhythm, No edema. Abdomen:  Soft, nontender and nondistended. Normal bowel sounds, without guarding, and without rebound.   Neurologic:  Alert and  oriented x4;  grossly normal neurologically.  Impression/Plan: Nathaniel Pratt is here for an endoscopy and colonoscopy  to  be performed for  evaluation of iron deficiency anemia     Risks, benefits, limitations, and alternatives regarding endoscopy have been reviewed with the patient.  Questions have been answered.  All parties agreeable.   Jonathon Bellows, MD  04/07/2021, 11:33 AM iro

## 2021-04-08 ENCOUNTER — Telehealth: Payer: Self-pay | Admitting: Gastroenterology

## 2021-04-08 ENCOUNTER — Encounter: Payer: Self-pay | Admitting: Gastroenterology

## 2021-04-08 DIAGNOSIS — K6289 Other specified diseases of anus and rectum: Secondary | ICD-10-CM

## 2021-04-08 NOTE — Telephone Encounter (Signed)
Called patient to let him know that Dr. Vicente Males wants him to be referred to see Dr. Rush Landmark for an EUS. I told patient that I had sent the referral and that they would be calling him with an appointment date and time. I also provided him with his phone number: [phone]. I told patient to give them a call within in 5-7 business days incase they haven't reached out to him. He then had no furthered questions.

## 2021-04-08 NOTE — Telephone Encounter (Signed)
Patient called and had a EGD and Colonoscopy yesterday. Dr. Vicente Males talked to him about a mass and wants him to have a lower ultrasound. Patient would like to schedule that ASAP. Please call patient.

## 2021-04-08 NOTE — Anesthesia Postprocedure Evaluation (Signed)
Anesthesia Post Note  Patient: Nathaniel Pratt  Procedure(s) Performed: ESOPHAGOGASTRODUODENOSCOPY (EGD) WITH PROPOFOL COLONOSCOPY WITH PROPOFOL  Patient location during evaluation: PACU Anesthesia Type: General Level of consciousness: awake and alert Pain management: pain level controlled Vital Signs Assessment: post-procedure vital signs reviewed and stable Respiratory status: spontaneous breathing, nonlabored ventilation, respiratory function stable and patient connected to nasal cannula oxygen Cardiovascular status: blood pressure returned to baseline and stable Postop Assessment: no apparent nausea or vomiting Anesthetic complications: no   No notable events documented.   Last Vitals:  Vitals:   04/07/21 1221 04/07/21 1231  BP: 127/77 122/79  Pulse: 61 60  Resp: 17 14  Temp:    SpO2: 100% 100%    Last Pain:  Vitals:   04/08/21 0730  TempSrc:   PainSc: 0-No pain                 Molli Barrows

## 2021-04-09 ENCOUNTER — Telehealth: Payer: Self-pay

## 2021-04-09 ENCOUNTER — Other Ambulatory Visit: Payer: Self-pay

## 2021-04-09 ENCOUNTER — Telehealth: Payer: Self-pay | Admitting: Gastroenterology

## 2021-04-09 DIAGNOSIS — K921 Melena: Secondary | ICD-10-CM

## 2021-04-09 DIAGNOSIS — K6289 Other specified diseases of anus and rectum: Secondary | ICD-10-CM

## 2021-04-09 NOTE — Telephone Encounter (Signed)
Referral received from [place] for patient to have an EUS if possible to be evaluated and treated for rectal mass.  Please advise.

## 2021-04-09 NOTE — Telephone Encounter (Signed)
KA,  Thank you for sending this to [place]. Interesting case, and hopefully only a lipoma. I think a CT-Abdomen/Pelvis IV/PO contrast would be helpful to define things a bit further if you would be OK to get that completed. Patty, please work on scheduling the next available Lower EUS +/- FNA with DJ or myself in the coming weeks with intention that the CT scan is completed prior. OK for typical Lower EUS preparation. Please let Dr. Vicente Males know when that is scheduled. GM

## 2021-04-09 NOTE — Telephone Encounter (Signed)
Lower EUS scheduled for 04/29/21 at 830 am at [facility] with DJ.

## 2021-04-09 NOTE — Telephone Encounter (Signed)
Left message on machine to call back

## 2021-04-09 NOTE — Telephone Encounter (Signed)
Thanks for update. Patty, please move forward with Lower EUS DJ/Myself in coming weeks. Thanks. GM

## 2021-04-12 NOTE — Telephone Encounter (Signed)
Appointment was scheduled and patient was notified.

## 2021-04-12 NOTE — Telephone Encounter (Signed)
Called patient to let him know that Dr. Rush Landmark got in contact with Dr. Vicente Males and he recommended for him to have a CT Abdomen and Pelvis with contrast. I told patient that I had scheduled it and gave him the instructions. Patient agreed.

## 2021-04-12 NOTE — Telephone Encounter (Signed)
EUS scheduled, pt instructed and medications reviewed.  Patient instructions mailed to home.  Patient to call with any questions or concerns.

## 2021-04-13 ENCOUNTER — Telehealth: Payer: Self-pay | Admitting: Gastroenterology

## 2021-04-13 NOTE — Telephone Encounter (Signed)
The patient has been notified of this information and all questions answered.   He will take 1/2 bottle of 238 gram miralax with 32 oz gatorade (not red or purple) and fleet enema the morning of.    He will call back with any further questions or concerns.

## 2021-04-13 NOTE — Telephone Encounter (Signed)
Dr Lesly Dukes citrate is on back order and the pt has a Lower EUS scheduled.  What alternative prep can the pt use?  Miralax ?

## 2021-04-13 NOTE — Telephone Encounter (Signed)
Yes. 1/2 miralax prep with Fleet enemas in AM.  thanks

## 2021-04-23 ENCOUNTER — Other Ambulatory Visit: Payer: Self-pay

## 2021-04-23 ENCOUNTER — Encounter (HOSPITAL_COMMUNITY): Payer: Self-pay | Admitting: Gastroenterology

## 2021-04-27 ENCOUNTER — Other Ambulatory Visit: Payer: Self-pay

## 2021-04-27 ENCOUNTER — Ambulatory Visit
Admission: RE | Admit: 2021-04-27 | Discharge: 2021-04-27 | Disposition: A | Discharge status: Home / Self Care | Payer: BC Managed Care – PPO | Source: Ambulatory Visit | Attending: Gastroenterology | Admitting: Gastroenterology

## 2021-04-27 DIAGNOSIS — K921 Melena: Secondary | ICD-10-CM | POA: Insufficient documentation

## 2021-04-27 MED ORDER — IOHEXOL 350 MG/ML SOLN
100.0000 mL | Freq: Once | INTRAVENOUS | Status: AC | PRN
  Administered 2021-04-27: 100 mL via INTRAVENOUS

## 2021-04-28 NOTE — Anesthesia Preprocedure Evaluation (Addendum)
Anesthesia Evaluation  Patient identified by MRN, date of birth, ID band Patient awake    Reviewed: Allergy & Precautions, NPO status , Patient's Chart, lab work & pertinent test results  Airway Mallampati: II  TM Distance: >3 FB Neck ROM: Full    Dental no notable dental hx. (+) Teeth Intact, Dental Advisory Given   Pulmonary neg pulmonary ROS,    Pulmonary exam normal breath sounds clear to auscultation       Cardiovascular Exercise Tolerance: Good Normal cardiovascular exam Rhythm:Regular Rate:Normal     Neuro/Psych negative neurological ROS  negative psych ROS   GI/Hepatic negative GI ROS, Neg liver ROS,   Endo/Other  negative endocrine ROS  Renal/GU negative Renal ROS     Musculoskeletal negative musculoskeletal ROS (+)   Abdominal   Peds  Hematology   Anesthesia Other Findings All Amoxicillin and PCN  Reproductive/Obstetrics negative OB ROS                            Anesthesia Physical Anesthesia Plan  ASA: 2  Anesthesia Plan: MAC   Post-op Pain Management:    Induction:   PONV Risk Score and Plan: Treatment may vary due to age or medical condition  Airway Management Planned: Natural Airway and Nasal Cannula  Additional Equipment: None  Intra-op Plan:   Post-operative Plan:   Informed Consent: I have reviewed the patients History and Physical, chart, labs and discussed the procedure including the risks, benefits and alternatives for the proposed anesthesia with the patient or authorized representative who has indicated his/her understanding and acceptance.     Dental advisory given  Plan Discussed with: CRNA  Anesthesia Plan Comments: (Lower endoscopic Ultrasound for rectal mass)       Anesthesia Quick Evaluation

## 2021-04-29 ENCOUNTER — Encounter (HOSPITAL_COMMUNITY): Payer: Self-pay | Admitting: Gastroenterology

## 2021-04-29 ENCOUNTER — Ambulatory Visit (HOSPITAL_COMMUNITY): Payer: BC Managed Care – PPO | Admitting: Anesthesiology

## 2021-04-29 ENCOUNTER — Other Ambulatory Visit: Payer: Self-pay

## 2021-04-29 ENCOUNTER — Ambulatory Visit (HOSPITAL_COMMUNITY)
Admission: RE | Admit: 2021-04-29 | Discharge: 2021-04-29 | Disposition: A | Discharge status: Home / Self Care | Payer: BC Managed Care – PPO | Source: Ambulatory Visit | Attending: Gastroenterology | Admitting: Gastroenterology

## 2021-04-29 ENCOUNTER — Encounter (HOSPITAL_COMMUNITY)
Admission: RE | Disposition: A | Discharge status: Home / Self Care | Payer: Self-pay | Source: Ambulatory Visit | Attending: Gastroenterology

## 2021-04-29 DIAGNOSIS — Z880 Allergy status to penicillin: Secondary | ICD-10-CM | POA: Diagnosis not present

## 2021-04-29 DIAGNOSIS — D509 Iron deficiency anemia, unspecified: Secondary | ICD-10-CM | POA: Insufficient documentation

## 2021-04-29 DIAGNOSIS — K6289 Other specified diseases of anus and rectum: Secondary | ICD-10-CM | POA: Diagnosis not present

## 2021-04-29 DIAGNOSIS — D379 Neoplasm of uncertain behavior of digestive organ, unspecified: Secondary | ICD-10-CM | POA: Diagnosis not present

## 2021-04-29 DIAGNOSIS — J302 Other seasonal allergic rhinitis: Secondary | ICD-10-CM | POA: Diagnosis not present

## 2021-04-29 DIAGNOSIS — R933 Abnormal findings on diagnostic imaging of other parts of digestive tract: Secondary | ICD-10-CM | POA: Diagnosis not present

## 2021-04-29 HISTORY — PX: FINE NEEDLE ASPIRATION: SHX5430

## 2021-04-29 HISTORY — PX: EUS: SHX5427

## 2021-04-29 HISTORY — PX: FLEXIBLE SIGMOIDOSCOPY: SHX5431

## 2021-04-29 SURGERY — ULTRASOUND, LOWER GI TRACT, ENDOSCOPIC
Anesthesia: Monitor Anesthesia Care

## 2021-04-29 MED ORDER — PROPOFOL 500 MG/50ML IV EMUL
INTRAVENOUS | Status: DC | PRN
  Administered 2021-04-29: 150 ug/kg/min via INTRAVENOUS

## 2021-04-29 MED ORDER — LACTATED RINGERS IV SOLN: INTRAVENOUS | Status: DC

## 2021-04-29 MED ORDER — MIDAZOLAM HCL 2 MG/2ML IJ SOLN
INTRAMUSCULAR | Status: DC | PRN
  Administered 2021-04-29: 2 mg via INTRAVENOUS

## 2021-04-29 MED ORDER — PROPOFOL 1000 MG/100ML IV EMUL
INTRAVENOUS | Status: AC
  Filled 2021-04-29: qty 200

## 2021-04-29 MED ORDER — SODIUM CHLORIDE 0.9 % IV SOLN: INTRAVENOUS | Status: DC

## 2021-04-29 MED ORDER — LIDOCAINE HCL (CARDIAC) PF 100 MG/5ML IV SOSY
PREFILLED_SYRINGE | INTRAVENOUS | Status: DC | PRN
  Administered 2021-04-29: 100 mg via INTRAVENOUS

## 2021-04-29 MED ORDER — PROPOFOL 10 MG/ML IV BOLUS
INTRAVENOUS | Status: DC | PRN
  Administered 2021-04-29: 20 mg via INTRAVENOUS
  Administered 2021-04-29: 50 mg via INTRAVENOUS
  Administered 2021-04-29 (×2): 20 mg via INTRAVENOUS

## 2021-04-29 MED ORDER — MIDAZOLAM HCL 2 MG/2ML IJ SOLN
INTRAMUSCULAR | Status: AC
  Filled 2021-04-29: qty 2

## 2021-04-29 NOTE — Anesthesia Postprocedure Evaluation (Signed)
Anesthesia Post Note  Patient: Nathaniel Pratt  Procedure(s) Performed: LOWER ENDOSCOPIC ULTRASOUND (EUS) FINE NEEDLE ASPIRATION (FNA) LINEAR     Patient location during evaluation: Endoscopy Anesthesia Type: MAC Level of consciousness: awake and alert Pain management: pain level controlled Vital Signs Assessment: post-procedure vital signs reviewed and stable Respiratory status: spontaneous breathing, nonlabored ventilation, respiratory function stable and patient connected to nasal cannula oxygen Cardiovascular status: blood pressure returned to baseline and stable Postop Assessment: no apparent nausea or vomiting Anesthetic complications: no   No notable events documented.  Last Vitals:  Vitals:   04/29/21 0920 04/29/21 0923  BP: (!) 102/48   Pulse: (!) 52 (!) 49  Resp: 13 13  Temp:    SpO2: 98% 96%    Last Pain:  Vitals:   04/29/21 0729  TempSrc: Oral  PainSc: 0-No pain                 Barnet Glasgow

## 2021-04-29 NOTE — Interval H&P Note (Signed)
History and Physical Interval Note:  04/29/2021 8:24 AM  Nathaniel Pratt  has presented today for surgery, with the diagnosis of rectal mass.  The various methods of treatment have been discussed with the patient and family. After consideration of risks, benefits and other options for treatment, the patient has consented to  Procedure(s): LOWER ENDOSCOPIC ULTRASOUND (EUS) (N/A) as a surgical intervention.  The patient's history has been reviewed, patient examined, no change in status, stable for surgery.  I have reviewed the patient's chart and labs.  Questions were answered to the patient's satisfaction.     Milus Banister

## 2021-04-29 NOTE — Discharge Instructions (Signed)
YOU HAD AN ENDOSCOPIC PROCEDURE TODAY: Refer to the procedure report and other information in the discharge instructions given to you for any specific questions about what was found during the examination. If this information does not answer your questions, please call [place] office at [phone] to clarify.   YOU SHOULD EXPECT: Some feelings of bloating in the abdomen. Passage of more gas than usual. Walking can help get rid of the air that was put into your GI tract during the procedure and reduce the bloating. If you had a lower endoscopy (such as a colonoscopy or flexible sigmoidoscopy) you may notice spotting of blood in your stool or on the toilet paper. Some abdominal soreness may be present for a day or two, also.  DIET: Your first meal following the procedure should be a light meal and then it is ok to progress to your normal diet. A half-sandwich or bowl of soup is an example of a good first meal. Heavy or fried foods are harder to digest and may make you feel nauseous or bloated. Drink plenty of fluids but you should avoid alcoholic beverages for 24 hours. If you had a esophageal dilation, please see attached instructions for diet.    ACTIVITY: Your care partner should take you home directly after the procedure. You should plan to take it easy, moving slowly for the rest of the day. You can resume normal activity the day after the procedure however YOU SHOULD NOT DRIVE, use power tools, machinery or perform tasks that involve climbing or major physical exertion for 24 hours (because of the sedation medicines used during the test).   SYMPTOMS TO REPORT IMMEDIATELY: A gastroenterologist can be reached at any hour. Please call [phone]  for any of the following symptoms:  Following lower endoscopy (colonoscopy, flexible sigmoidoscopy) Excessive amounts of blood in the stool  Significant tenderness, worsening of abdominal pains  Swelling of the abdomen that is new, acute  Fever of 100 or  higher   FOLLOW UP:  If any biopsies were taken you will be contacted by phone or by letter within the next 1-3 weeks. Call [phone]  if you have not heard about the biopsies in 3 weeks.  Please also call with any specific questions about appointments or follow up tests.

## 2021-04-29 NOTE — Transfer of Care (Signed)
Immediate Anesthesia Transfer of Care Note  Patient: Nathaniel Pratt  Procedure(s) Performed: LOWER ENDOSCOPIC ULTRASOUND (EUS) FINE NEEDLE ASPIRATION (FNA) LINEAR  Patient Location: Endoscopy Unit  Anesthesia Type:MAC  Level of Consciousness: drowsy  Airway & Oxygen Therapy: Patient Spontanous Breathing  Post-op Assessment: Report given to RN and Post -op Vital signs reviewed and stable  Post vital signs: Reviewed and stable  Last Vitals:  Vitals Value Taken Time  BP 94/34 04/29/21 0910  Temp    Pulse 111 04/29/21 0912  Resp 12 04/29/21 0912  SpO2 100 % 04/29/21 0912  Vitals shown include unvalidated device data.  Last Pain:  Vitals:   04/29/21 0729  TempSrc: Oral  PainSc: 0-No pain         Complications: No notable events documented.

## 2021-04-29 NOTE — Op Note (Signed)
[facility] Patient Name: Nathaniel Pratt Procedure Date: 04/29/2021 MRN: [id number] Attending MD: Milus Banister , MD Date of Birth: 1991-06-04 CSN: [id number] Age: 30 Admit Type: Outpatient Procedure:                Lower EUS Indications:              IDA led to GI testing (colonoscopy and EGD by Dr.                            Jonathon Bellows), colonoscopy showed a 2cm subepithelial                            lesion in the rectosigmoid, this was confirmed on                            CT and those images show the lesion is probably not                            a lipoma Providers:                Milus Banister, MD, Doristine Johns, RN, Cherylynn Ridges, Technician Referring MD:             Jonathon Bellows, MD Medicines:                Monitored Anesthesia Care Complications:            No immediate complications. Estimated blood loss:                            None. Estimated Blood Loss:     Estimated blood loss: none. Procedure:                Pre-Anesthesia Assessment:                           - Prior to the procedure, a History and Physical                            was performed, and patient medications and                            allergies were reviewed. The patient's tolerance of                            previous anesthesia was also reviewed. The risks                            and benefits of the procedure and the sedation                            options and risks were discussed with the patient.  All questions were answered, and informed consent                            was obtained. Prior Anticoagulants: The patient has                            taken no previous anticoagulant or antiplatelet                            agents. ASA Grade Assessment: I - A normal, healthy                            patient. After reviewing the risks and benefits,                            the patient was deemed in  satisfactory condition to                            undergo the procedure.                           After obtaining informed consent, the endoscope was                            passed under direct vision. Throughout the                            procedure, the patient's blood pressure, pulse, and                            oxygen saturations were monitored continuously. The                            [id number]-[id number] ([id number]) Olympus radial ultrasound                            scope was introduced through the anus and advanced                            to the the sigmoid colon for ultrasound. The                            [id number] ([id number]) Olympus linear ultrasound scope                            was introduced through the anus and advanced to the                            the rectosigmoid junction for ultrasound. The                            CF-[id number] ([id number]) Olympus colonoscope was  introduced through the anus and advanced to the the                            rectosigmoid junction for ultrasound. The lower EUS                            was accomplished without difficulty. The patient                            tolerated the procedure well. The quality of the                            bowel preparation was good. Scope In: Scope Out: Findings:      Sigmoidoscopic findings:      1. Approximately 2cm, endophytic subepithelial rectal lesion that is       attatched to the rectal wall with a broad based peduncle. This is       located 7-8cm from the anal verge (palpable on rectal exam). The lesion       is focally friable, see images.      2. Otherwise normal examination to the mid sigmoid colon      EUS findings:      1. The subepithelial lesion described above correlated with a well       circumscribed, 2.3cm, hypoechoic, homogeneous mass that clearly       communicated with the muscularis propria layer of the rectal wall. Using       two passes  with a 22gauge EUS FNA needle I sampled the lesion.       Preliminary cytology review showed spindle cells.      2. No perirectal adenopathy.      ENDOSONOGRAPHIC FINDING: : Impression:               - 2.3cm endophytic, hypoechoic, homogeneous                            subepithelial lesion that clearly communicates with                            the muscularis propria layer of the rectal wall                            about 7cm from the anal verge. Preliminary FNA                            shows spindle shaped cells, await final cytology                            review but this is very likely a GIST or perhaps a                            leiomyoma. The lesion is focally very friable and                            probably led to his IDA. Moderate Sedation:      Not Applicable -  Patient had care per Anesthesia. Recommendation:           - Discharge patient to home.                           - Await final cytology testing.                           - This lesion will likely need to be resected by a                            surgeon. Procedure Code(s):        --- Professional ---                           [phone], Sigmoidoscopy, flexible; with                            transendoscopic ultrasound guided intramural or                            transmural fine needle aspiration/biopsy(s) Diagnosis Code(s):        --- Professional ---                           K62.89, Other specified diseases of anus and rectum                           R93.3, Abnormal findings on diagnostic imaging of                            other parts of digestive tract CPT copyright 2019 American Medical Association. All rights reserved. The codes documented in this report are preliminary and upon coder review may  be revised to meet current compliance requirements. Milus Banister, MD 04/29/2021 9:20:19 AM This report has been signed electronically. Number of Addenda: 0

## 2021-04-30 ENCOUNTER — Encounter (HOSPITAL_COMMUNITY): Payer: Self-pay | Admitting: Gastroenterology

## 2021-05-03 ENCOUNTER — Telehealth: Payer: Self-pay | Admitting: Gastroenterology

## 2021-05-03 NOTE — Telephone Encounter (Signed)
Patient called asking about [place] results

## 2021-05-04 NOTE — Telephone Encounter (Signed)
The pt has been advised that the results are not available.  He has been advised that as soon as resulted we will contact him.  The pt has been advised of the information and verbalized understanding.

## 2021-05-06 LAB — CYTOLOGY - NON PAP

## 2021-05-06 NOTE — Progress Notes (Signed)
Thank you so much . Nathaniel Pratt can you refer the patient to Dr Annye English please for surgical evaluation

## 2021-05-13 ENCOUNTER — Telehealth: Payer: Self-pay

## 2021-05-13 DIAGNOSIS — K6289 Other specified diseases of anus and rectum: Secondary | ICD-10-CM

## 2021-05-13 NOTE — Telephone Encounter (Signed)
Referral was sent to [facility] per Dr. Vicente Males. They will call patient to schedule his consult with Dr. Annye English.

## 2021-05-13 NOTE — Telephone Encounter (Signed)
-----  Message from Jonathon Bellows, MD sent at 05/06/2021 12:11 PM EDT ----- Thank you so much . Herb Grays can you refer the patient to Dr Annye English please for surgical evaluation

## 2021-05-25 DIAGNOSIS — K6289 Other specified diseases of anus and rectum: Secondary | ICD-10-CM | POA: Diagnosis not present

## 2021-06-10 ENCOUNTER — Encounter: Payer: Self-pay | Admitting: Licensed Clinical Social Worker

## 2021-06-10 ENCOUNTER — Inpatient Hospital Stay: Payer: BC Managed Care – PPO | Attending: Oncology | Admitting: Licensed Clinical Social Worker

## 2021-06-10 ENCOUNTER — Inpatient Hospital Stay: Payer: BC Managed Care – PPO

## 2021-06-10 DIAGNOSIS — Z803 Family history of malignant neoplasm of breast: Secondary | ICD-10-CM

## 2021-06-10 NOTE — Progress Notes (Signed)
REFERRING PROVIDER: Ileana Roup, MD [place],  [place] [address]  PRIMARY PROVIDER:  Glean Hess, MD  PRIMARY REASON FOR VISIT:  1. Family history of breast cancer      HISTORY OF PRESENT ILLNESS:   Mr. Sayres, a 30 y.o. male, was seen for a [facility] cancer genetics consultation at the request of Dr. Dema Severin due to a personal history of a rectosigmoid colon mass.  Mr. Steib presents to clinic today to discuss the possibility of a hereditary predisposition to cancer, genetic testing, and to further clarify his future cancer risks, as well as potential cancer risks for family members.   Mr. Elk is a 30 y.o. male with no personal history of cancer. A rectosigmoid colon mass was found on colonoscopy in 2022 that is thought to be either a neurofibroma or schwannoma. A resection is planned for December. Mr. Macintyre has not had any other cancer screenings and does not report history of signs of NF1/NF2 such as cafe au lait macules, history of neurofibromas, Lisch nodule, history of schwannomas.   CANCER HISTORY:  Oncology History   No history exists.    Past Medical History:  Diagnosis Date   Allergy    Broken nose 2008   Family history of breast cancer    Thumb fracture 02/2009    Past Surgical History:  Procedure Laterality Date   COLONOSCOPY WITH PROPOFOL N/A 04/07/2021   Procedure: COLONOSCOPY WITH PROPOFOL;  Surgeon: Jonathon Bellows, MD;  Location: [facility] ENDOSCOPY;  Service: Gastroenterology;  Laterality: N/A;   ESOPHAGOGASTRODUODENOSCOPY (EGD) WITH PROPOFOL N/A 04/07/2021   Procedure: ESOPHAGOGASTRODUODENOSCOPY (EGD) WITH PROPOFOL;  Surgeon: Jonathon Bellows, MD;  Location: [facility] ENDOSCOPY;  Service: Gastroenterology;  Laterality: N/A;   EUS N/A 04/29/2021   Procedure: LOWER ENDOSCOPIC ULTRASOUND (EUS);  Surgeon: Milus Banister, MD;  Location: [facility] ENDOSCOPY;  Service: Endoscopy;  Laterality: N/A;   FINE NEEDLE ASPIRATION N/A 04/29/2021   Procedure: FINE NEEDLE  ASPIRATION (FNA) LINEAR;  Surgeon: Milus Banister, MD;  Location: [facility] ENDOSCOPY;  Service: Endoscopy;  Laterality: N/A;   FLEXIBLE SIGMOIDOSCOPY N/A 04/29/2021   Procedure: FLEXIBLE SIGMOIDOSCOPY;  Surgeon: Milus Banister, MD;  Location: [facility] ENDOSCOPY;  Service: Endoscopy;  Laterality: N/A;   ORIF FINGER / THUMB FRACTURE     RHINOPLASTY  2008    Social History   Socioeconomic History   Marital status: Married    Spouse name: Aseel Uhde   Number of children: 1   Years of education: 16   Highest education level: Bachelor's degree (e.g., BA, AB, BS)  Occupational History   Not on file  Tobacco Use   Smoking status: Never   Smokeless tobacco: Never  Vaping Use   Vaping Use: Never used  Substance and Sexual Activity   Alcohol use: Not Currently   Drug use: Never   Sexual activity: Yes  Other Topics Concern   Not on file  Social History Narrative   Not on file   Social Determinants of Health   Financial Resource Strain: Not on file  Food Insecurity: Not on file  Transportation Needs: Not on file  Physical Activity: Not on file  Stress: Not on file  Social Connections: Not on file     FAMILY HISTORY:  We obtained a detailed, 4-generation family history.  Significant diagnoses are listed below: Family History  Problem Relation Age of Onset   Hypertension Mother    Allergies Mother    Hypertension Father    Bleeding Disorder  Maternal Grandmother    Heart attack Maternal Grandfather    Breast cancer Paternal Grandmother    Clotting disorder Paternal Grandfather    Mr. Lodge has 1 son, no cancer. He does not have siblings.  Mr. Nettle mother is living at 30, no cancer. No known cancers on this side of the family.  Mr. Kritikos father is living at 30. Patient has 2 paternal uncles. Paternal grandmother has breast cancer in her 89s and possibly a jaw cancer. No cancer for paternal grandfather.  Mr. Klippel is unaware of previous family history of genetic testing for  hereditary cancer risks. Patient's maternal ancestors are of [place], [place] descent, and paternal ancestors are of [place], [place] descent. There is no reported Ashkenazi Jewish ancestry. There is no known consanguinity.    GENETIC COUNSELING ASSESSMENT: Mr. Romanoski is a 30 y.o. male with a personal history of a colon mass that is possibly a neurofibroma or a schwannoma which is somewhat suggestive of a hereditary cancer syndrome and predisposition to cancer. We, therefore, discussed and recommended the following at today's visit.   DISCUSSION: We discussed the NF1 and NF2 genes. NF1 is a multisystem disorder characterized by multiple cafe-au-lait macules, multiple cutaneous neurofibromas, other features, and cancer risks including leukemias, gliomas and breast cancer. NF2 is characterized by bilateral vestibular schwannomas and other schwannomas.  Mr. Bracamonte reports he has not had history of these signs/symptoms. He also does not report family history of these signs, however NF1 has a high de novo rate of 50%. We discussed that approximately 5-10% of  cancer in general is hereditary.  We can look at other genes related to other types of cancer such as breast, prostate, pancreatic, colon. Cancers and risks are gene specific.  We discussed that testing is beneficial for several reasons including knowing about other cancer risks, identifying potential screening and risk-reduction options that may be appropriate, and to understand if other family members could be at risk for cancer and allow them to undergo genetic testing.   We reviewed the characteristics, features and inheritance patterns of hereditary cancer syndromes. We also discussed genetic testing, including the appropriate family members to test, the process of testing, insurance coverage and turn-around-time for results. We discussed the implications of a negative, positive and/or variant of uncertain significant result. We recommended Mr.  Echeverri pursue genetic testing for the [facility] CancerNext-Expanded+RNA gene panel.   The CancerNext-Expanded + RNAinsight gene panel offered by [employer] and includes sequencing and rearrangement analysis for the following 77 genes: IP, ALK, APC*, ATM*, AXIN2, BAP1, BARD1, BLM, BMPR1A, BRCA1*, BRCA2*, BRIP1*, CDC73, CDH1*,CDK4, CDKN1B, CDKN2A, CHEK2*, CTNNA1, DICER1, FANCC, FH, FLCN, GALNT12, KIF1B, LZTR1, MAX, MEN1, MET, MLH1*, MSH2*, MSH3, MSH6*, MUTYH*, NBN, NF1*, NF2, NTHL1, PALB2*, PHOX2B, PMS2*, POT1, PRKAR1A, PTCH1, PTEN*, RAD51C*, RAD51D*,RB1, RECQL, RET, SDHA, SDHAF2, SDHB, SDHC, SDHD, SMAD4, SMARCA4, SMARCB1, SMARCE1, STK11, SUFU, TMEM127, TP53*,TSC1, TSC2, VHL and XRCC2 (sequencing and deletion/duplication); EGFR, EGLN1, HOXB13, KIT, MITF, PDGFRA, POLD1 and POLE (sequencing only); EPCAM and GREM1 (deletion/duplication only).  Based on Mr. Piercefield personal history of cancer, he does not meet medical criteria for genetic testing. He may have an out of pocket cost.  PLAN: After considering the risks, benefits, and limitations, Mr. Vayda provided informed consent to pursue genetic testing and the blood sample was sent to [facility] for analysis of the CancerNext-Expanded+RNA. Results should be available within approximately 2-3 weeks' time, at which point they will be disclosed by telephone to Mr. Costilla, as will any additional recommendations  warranted by these results. Mr. Jefferys will receive a summary of his genetic counseling visit and a copy of his results once available. This information will also be available in Epic.   Mr. Cremer questions were answered to his satisfaction today. Our contact information was provided should additional questions or concerns arise. Thank you for the referral and allowing [place] to share in the care of your patient.   Faith Rogue, MS, [facility] Genetic Counselor [email or web address] Phone: [phone]  The patient was seen for a total of 25  minutes in face-to-face genetic counseling.  Patient was seen alone. Dr. Grayland Ormond was available for discussion regarding this case.   _______________________________________________________________________ For Office Staff:  Number of people involved in session: 1 Was an Intern/ student involved with case: no

## 2021-06-23 ENCOUNTER — Telehealth (INDEPENDENT_AMBULATORY_CARE_PROVIDER_SITE_OTHER): Payer: BC Managed Care – PPO | Admitting: Gastroenterology

## 2021-06-23 DIAGNOSIS — Z791 Long term (current) use of non-steroidal anti-inflammatories (NSAID): Secondary | ICD-10-CM | POA: Diagnosis not present

## 2021-06-23 DIAGNOSIS — D509 Iron deficiency anemia, unspecified: Secondary | ICD-10-CM | POA: Diagnosis not present

## 2021-06-23 DIAGNOSIS — K6289 Other specified diseases of anus and rectum: Secondary | ICD-10-CM | POA: Diagnosis not present

## 2021-06-23 NOTE — Progress Notes (Signed)
Nathaniel Pratt , MD [address]  [place]  [place], [place] [address]  Main: [phone]  Fax: [phone]   Primary Care Physician: Glean Hess, MD  Virtual Visit via Video Note  I connected with patient on 06/23/21 at  9:45 AM EDT by video and verified that I am speaking with the correct person using two identifiers.   I discussed the limitations, risks, security and privacy concerns of performing an evaluation and management service by video  and the availability of in person appointments. I also discussed with the patient that there may be a patient responsible charge related to this service. The patient expressed understanding and agreed to proceed.  Location of Patient: Home Location of Provider: Home Persons involved: Patient and provider only   History of Present Illness:   Chief complaint follow-up for rectal mass  HPI: Nathaniel Pratt is a 30 y.o. male  Summary of history :   Initially referred and seen on 03/23/2021 for iron deficiency anemia.  Noticed blood in stool on and off for 7 years.  Has been on Excedrin's 2 tablets a day for many years.  Hemoglobin is 12.9 g.  And ferritin of 6.   Interval history 03/23/2021-06/23/2021  03/23/2021: H. pylori breath test negative, B12, folate, urine analysis, celiac serology was normal. 04/07/2021:EGD was normal.  A polypoid nonobstructing large mass was seen in the rectosigmoid colon it was partially circumferential and measured 2 cm in length and 13 mm in diameter.  No bleeding was noted.  Overlying mucosa was normal. 04/27/2021: CT abdomen and pelvis with contrast showed 2.4 cm rectal neoplasm is not characteristic for lipoma noted. He subsequently underwent an EUS of the rectal lesion and showed spindle cell proliferation on FNA.  He has been set up for robot assisted sigmoidectomy with Dr. Annye English.   Once he stopped using NSAIDs.  Had complete resolution of the rectal bleeding.  No symptoms  presently.      Current Outpatient Medications  Medication Sig Dispense Refill   albuterol (VENTOLIN HFA) 108 (90 Base) MCG/ACT inhaler Inhale 1 Inhaler into the lungs every 6 (six) hours as needed.     cetirizine (ZYRTEC) 10 MG tablet Take 10 mg by mouth daily.     magnesium oxide (MAG-OX) 400 MG tablet Take 400 mg by mouth daily.     No current facility-administered medications for this visit.    Allergies as of 06/23/2021 - Review Complete 04/29/2021  Allergen Reaction Noted   Amoxicillin Hives 12/09/2020   Penicillins Hives and Itching 12/09/2020    Review of Systems:    All systems reviewed and negative except where noted in HPI.  General Appearance:    Alert, cooperative, no distress, appears stated age  Head:    Normocephalic, without obvious abnormality, atraumatic  Eyes:    PERRL, conjunctiva/corneas clear,  Ears:    Grossly normal hearing    Neurologic:  Grossly normal    Observations/Objective:  Labs: CMP     Component Value Date/Time   NA 143 12/09/2020 1546   K 4.3 12/09/2020 1546   CL 102 12/09/2020 1546   CO2 23 12/09/2020 1546   GLUCOSE 83 12/09/2020 1546   BUN 23 (H) 12/09/2020 1546   CREATININE 0.97 12/09/2020 1546   CALCIUM 9.3 12/09/2020 1546   PROT 6.9 12/09/2020 1546   ALBUMIN 4.5 12/09/2020 1546   AST 21 12/09/2020 1546   ALT 15 12/09/2020 1546   ALKPHOS 64 12/09/2020 1546  BILITOT <0.2 12/09/2020 1546   Lab Results  Component Value Date   WBC 5.0 01/08/2021   HGB 12.9 (L) 01/08/2021   HCT 39.2 01/08/2021   MCV 81 01/08/2021   PLT 268 01/08/2021    Imaging Studies: No results found.  Assessment and Plan:   Nathaniel Pratt is a 30 y.o. y/o male initially referred for iron deficiency anemia and on endoscopy was found to have a submucosal rectal lesion which was about 3 cm in size.  Did not have the appearance of a normal polyp or colon cancer.  Referred for EUS which showed spindle cell tissue and has been subsequently referred  to Dr. Annye English for resection surgically.  He is scheduled to undergo surgery in November.  Presently has no rectal bleeding after he stopped using all Excedrin's.  Plan 1.  I will follow-up with him back in December or January after he has had surgery to review the pathology and determine if anything further needs to be done.  At that point of time I will repeat his labs including iron studies to make sure he is returning back to normal.        I discussed the assessment and treatment plan with the patient. The patient was provided an opportunity to ask questions and all were answered. The patient agreed with the plan and demonstrated an understanding of the instructions.   The patient was advised to call back or seek an in-person evaluation if the symptoms worsen or if the condition fails to improve as anticipated.  I provided 15 minutes of face-to-face time during this encounter.  Dr Nathaniel Bellows MD,MRCP [facility]) Gastroenterology/Hepatology Pager: [phone]   Speech recognition software was used to dictate this note.

## 2021-06-24 ENCOUNTER — Encounter: Payer: Self-pay | Admitting: Licensed Clinical Social Worker

## 2021-06-24 ENCOUNTER — Telehealth: Payer: Self-pay | Admitting: Licensed Clinical Social Worker

## 2021-06-24 ENCOUNTER — Ambulatory Visit: Payer: Self-pay | Admitting: Licensed Clinical Social Worker

## 2021-06-24 DIAGNOSIS — Z1379 Encounter for other screening for genetic and chromosomal anomalies: Secondary | ICD-10-CM | POA: Insufficient documentation

## 2021-06-24 DIAGNOSIS — Z803 Family history of malignant neoplasm of breast: Secondary | ICD-10-CM

## 2021-06-24 NOTE — Telephone Encounter (Signed)
Revealed negative genetic testing.  This normal result is reassuring.  It is unlikely that there is an increased risk of  cancer due to a mutation in one of these genes.  However, genetic testing is not perfect, and cannot definitively rule out a hereditary cause.  It will be important for him to keep in contact with genetics to learn if any additional testing may be needed in the future.

## 2021-06-24 NOTE — Progress Notes (Addendum)
HPI:   Mr. Blatt was previously seen in the [place] clinic due to a personal history of a rectosigmoid colon mass. Please refer to our prior cancer genetics clinic note for more information regarding our discussion, assessment and recommendations, at the time. Mr. Mceuen recent genetic test results were disclosed to him, as were recommendations warranted by these results. These results and recommendations are discussed in more detail below.  Mr. Trageser is a 30 y.o. male with no personal history of cancer. A rectosigmoid colon mass was found on colonoscopy in 2022 that is thought to be either a neurofibroma or schwannoma. A resection is planned for December. Mr. Selover has not had any other cancer screenings and does not report history of signs of NF1/NF2 such as cafe au lait macules, history of neurofibromas, Lisch nodule, history of schwannomas.   CANCER HISTORY:  Oncology History   No history exists.    FAMILY HISTORY:  We obtained a detailed, 4-generation family history.  Significant diagnoses are listed below: Family History  Problem Relation Age of Onset   Hypertension Mother    Allergies Mother    Hypertension Father    Bleeding Disorder Maternal Grandmother    Heart attack Maternal Grandfather    Breast cancer Paternal Grandmother    Clotting disorder Paternal Grandfather    Mr. Sickinger has 1 son, no cancer. He does not have siblings.   Mr. Treiber mother is living at 49, no cancer. No known cancers on this side of the family.   Mr. Cokley father is living at 21. Patient has 2 paternal uncles. Paternal grandmother has breast cancer in her 54s and possibly a jaw cancer. No cancer for paternal grandfather.   Mr. Eshbach is unaware of previous family history of genetic testing for hereditary cancer risks. Patient's maternal ancestors are of [place], [place] descent, and paternal ancestors are of [place], [place] descent. There is no reported Ashkenazi Jewish  ancestry. There is no known consanguinity.    GENETIC TEST RESULTS:  The [employer] CancerNext-Expanded+RNA Panel found no pathogenic mutations. The CancerNext-Expanded + RNAinsight gene panel offered by [employer] and includes sequencing and rearrangement analysis for the following 77 genes: IP, ALK, APC*, ATM*, AXIN2, BAP1, BARD1, BLM, BMPR1A, BRCA1*, BRCA2*, BRIP1*, CDC73, CDH1*,CDK4, CDKN1B, CDKN2A, CHEK2*, CTNNA1, DICER1, FANCC, FH, FLCN, GALNT12, KIF1B, LZTR1, MAX, MEN1, MET, MLH1*, MSH2*, MSH3, MSH6*, MUTYH*, NBN, NF1*, NF2, NTHL1, PALB2*, PHOX2B, PMS2*, POT1, PRKAR1A, PTCH1, PTEN*, RAD51C*, RAD51D*,RB1, RECQL, RET, SDHA, SDHAF2, SDHB, SDHC, SDHD, SMAD4, SMARCA4, SMARCB1, SMARCE1, STK11, SUFU, TMEM127, TP53*,TSC1, TSC2, VHL and XRCC2 (sequencing and deletion/duplication); EGFR, EGLN1, HOXB13, KIT, MITF, PDGFRA, POLD1 and POLE (sequencing only); EPCAM and GREM1 (deletion/duplication only).   The test report has been scanned into EPIC and is located under the Molecular Pathology section of the Results Review tab.  A portion of the result report is included below for reference. Genetic testing reported out on 06/23/2021.     Even though a pathogenic variant was not identified, possible explanations for the cancer in the family may include: There may be no hereditary risk for cancer in the family. The cancers in Mr. Reinwald family and his rectosigmoid mass may be due to other genetic or environmental factors. There may be a gene mutation in one of these genes that current testing methods cannot detect, but that chance is small. There could be another gene that has not yet been discovered, or that we have not yet tested, that is responsible for the cancer diagnoses  in the family.  It is also possible there is a hereditary cause for the cancer in the family that Mr. San did not inherit. Therefore, it is important to remain in touch with cancer genetics in the future so that we can continue to offer  Mr. Finklea the most up to date genetic testing.   ADDITIONAL GENETIC TESTING:  We discussed with Mr. Dilone that his genetic testing was fairly extensive.  If there are genes identified to increase cancer risk that can be analyzed in the future, we would be happy to discuss and coordinate this testing at that time.    CANCER SCREENING RECOMMENDATIONS:  Mr. Husak test result is considered negative (normal).  This means that we have not identified a hereditary cause for his family history of cancer or for his rectosigmoid mass at this time.   An individual's cancer risk and medical management are not determined by genetic test results alone. Overall cancer risk assessment incorporates additional factors, including personal medical history, family history, and any available genetic information that may result in a personalized plan for cancer prevention and surveillance. Therefore, it is recommended he continue to follow the cancer management and screening guidelines provided by his primary healthcare provider.  RECOMMENDATIONS FOR FAMILY MEMBERS:   Since he did not inherit a mutation in a cancer predisposition gene included on this panel, his children could not have inherited a mutation from him in one of these genes. Individuals in this family might be at some increased risk of developing cancer, over the general population risk, due to the family history of cancer. We recommend women in this family have a yearly mammogram beginning at age 68, or 50 years younger than the earliest onset of cancer, an annual clinical breast exam, and perform monthly breast self-exams.   FOLLOW-UP:  Lastly, we discussed with Mr. Rivadeneira that cancer genetics is a rapidly advancing field and it is possible that new genetic tests will be appropriate for him and/or his family members in the future. We encouraged him to remain in contact with cancer genetics on an annual basis so we can update his personal and family histories  and let him know of advances in cancer genetics that may benefit this family.   Our contact number was provided. Mr. Crilly questions were answered to his satisfaction, and he knows he is welcome to call us at anytime with additional questions or concerns.    Faith Rogue, MS, [facility] Genetic Counselor [email or web address] Phone: [phone]

## 2021-07-21 NOTE — Progress Notes (Signed)
Sent message, via epic in basket, requesting orders in epic from Psychologist, sport and exercise.

## 2021-08-02 NOTE — Patient Instructions (Signed)
DUE TO COVID-19 ONLY ONE VISITOR IS ALLOWED TO COME WITH YOU AND STAY IN THE WAITING ROOM ONLY DURING PRE OP AND PROCEDURE.   **NO VISITORS ARE ALLOWED IN THE SHORT STAY AREA OR RECOVERY ROOM!!**  IF YOU WILL BE ADMITTED INTO THE HOSPITAL YOU ARE ALLOWED ONLY TWO SUPPORT PEOPLE DURING VISITATION HOURS ONLY (7 AM -8PM)   The support person(s) must pass our screening, gel in and out, and wear a mask at all times, including in the patient's room. Patients must also wear a mask when staff or their support person are in the room. Visitors GUEST BADGE MUST BE WORN VISIBLY  One adult visitor may remain with you overnight and MUST be in the room by 8 P.M.  No visitors under the age of 38. Any visitor under the age of 59 must be accompanied by an adult.    COVID SWAB TESTING MUST BE COMPLETED ON: 08/11/21 **MUST PRESENT COMPLETED FORM AT TESTING SITE**    [facility] (backside of the building) You are not required to quarantine, however you are required to wear a well-fitted mask when you are out and around people not in your household.  Hand Hygiene often Do NOT share personal items Notify your provider if you are in close contact with someone who has COVID or you develop fever 100.4 or greater, new onset of sneezing, cough, sore throat, shortness of breath or body aches.  [address], [address], must go inside of the hospital, NOT A DRIVE THRU!  (Must self quarantine after testing. Follow instructions on handout.)       Your procedure is scheduled on: 08/13/21   Report to [facility] Main Entrance    Report to Short stay at : 5:15 AM   Call this number if you have problems the morning of surgery [phone]   May have liquids until : 4:30 AM   day of surgery  CLEAR LIQUID DIET starting the day before surgery.  Foods Allowed                                                                      Foods Excluded  Water, Black Coffee and tea, regular and decaf                             liquids that you cannot  Plain Jell-O in any flavor  (No red)                                           see through such as: Fruit ices (not with fruit pulp)                                     milk, soups, orange juice              Iced Popsicles (No red)  All solid food                                   Apple juices Sports drinks like Gatorade (No red) Lightly seasoned clear broth or consume(fat free) Sugar  Sample Menu Breakfast                                Lunch                                     Supper Cranberry juice                    Beef broth                            Chicken broth Jell-O                                     Grape juice                           Apple juice Coffee or tea                        Jell-O                                      Popsicle                                                Coffee or tea                        Coffee or tea    Oral Hygiene is also important to reduce your risk of infection.                                    Remember - BRUSH YOUR TEETH THE MORNING OF SURGERY WITH YOUR REGULAR TOOTHPASTE   Do NOT smoke after Midnight   Take these medicines the morning of surgery with A SIP OF WATER: cetirizine.Use inhalers as usual.  DO NOT TAKE ANY ORAL DIABETIC MEDICATIONS DAY OF YOUR SURGERY                              You may not have any metal on your body including hair pins, jewelry, and body piercing             Do not wear lotions, powders, perfumes/cologne, or deodorant               Men may shave face and neck.   Do not bring valuables to the hospital. [facility].  Contacts, dentures or bridgework may not be worn into surgery.   Bring small overnight bag day of surgery.    Patients discharged on the day of surgery will not be allowed to drive  home.   Special Instructions: Bring a copy of your healthcare power of attorney and living will documents         the day of surgery if you haven't scanned them before.              Please read over the following fact sheets you were given: IF YOU HAVE QUESTIONS ABOUT YOUR PRE-OP INSTRUCTIONS PLEASE CALL [phone]     [facility] - Preparing for Surgery Before surgery, you can play an important role.  Because skin is not sterile, your skin needs to be as free of germs as possible.  You can reduce the number of germs on your skin by washing with CHG (chlorahexidine gluconate) soap before surgery.  CHG is an antiseptic cleaner which kills germs and bonds with the skin to continue killing germs even after washing. Please DO NOT use if you have an allergy to CHG or antibacterial soaps.  If your skin becomes reddened/irritated stop using the CHG and inform your nurse when you arrive at Short Stay. Do not shave (including legs and underarms) for at least 48 hours prior to the first CHG shower.  You may shave your face/neck. Please follow these instructions carefully:  1.  Shower with CHG Soap the night before surgery and the  morning of Surgery.  2.  If you choose to wash your hair, wash your hair first as usual with your  normal  shampoo.  3.  After you shampoo, rinse your hair and body thoroughly to remove the  shampoo.                           4.  Use CHG as you would any other liquid soap.  You can apply chg directly  to the skin and wash                       Gently with a scrungie or clean washcloth.  5.  Apply the CHG Soap to your body ONLY FROM THE NECK DOWN.   Do not use on face/ open                           Wound or open sores. Avoid contact with eyes, ears mouth and genitals (private parts).                       Wash face,  Genitals (private parts) with your normal soap.             6.  Wash thoroughly, paying special attention to the area where your surgery  will be performed.  7.   Thoroughly rinse your body with warm water from the neck down.  8.  DO NOT shower/wash with your normal soap after using and rinsing off  the CHG Soap.                9.  Pat yourself dry with a clean towel.            10.  Wear clean pajamas.            11.  Place clean sheets on your bed the night of your  first shower and do not  sleep with pets. Day of Surgery : Do not apply any lotions/deodorants the morning of surgery.  Please wear clean clothes to the hospital/surgery center.  FAILURE TO FOLLOW THESE INSTRUCTIONS MAY RESULT IN THE CANCELLATION OF YOUR SURGERY PATIENT SIGNATURE_________________________________  NURSE SIGNATURE__________________________________  ________________________________________________________________________

## 2021-08-03 ENCOUNTER — Encounter (HOSPITAL_COMMUNITY)
Admission: RE | Admit: 2021-08-03 | Discharge: 2021-08-03 | Disposition: A | Discharge status: Home / Self Care | Payer: BC Managed Care – PPO | Source: Ambulatory Visit | Attending: Thoracic Diseases | Admitting: Thoracic Diseases

## 2021-08-03 ENCOUNTER — Other Ambulatory Visit: Payer: Self-pay

## 2021-08-03 ENCOUNTER — Encounter (HOSPITAL_COMMUNITY): Payer: Self-pay

## 2021-08-03 VITALS — BP 114/69 | HR 76 | Temp 98.2°F | Ht 72.0 in | Wt 190.0 lb

## 2021-08-03 DIAGNOSIS — Z01812 Encounter for preprocedural laboratory examination: Secondary | ICD-10-CM | POA: Diagnosis not present

## 2021-08-03 DIAGNOSIS — Z01818 Encounter for other preprocedural examination: Secondary | ICD-10-CM

## 2021-08-03 HISTORY — DX: Anemia, unspecified: D64.9

## 2021-08-03 HISTORY — DX: Unspecified asthma, uncomplicated: J45.909

## 2021-08-03 LAB — CBC
HCT: 42 % (ref 39.0–52.0)
Hemoglobin: 13.7 g/dL (ref 13.0–17.0)
MCH: 29.5 pg (ref 26.0–34.0)
MCHC: 32.6 g/dL (ref 30.0–36.0)
MCV: 90.3 fL (ref 80.0–100.0)
Platelets: 204 10*3/uL (ref 150–400)
RBC: 4.65 MIL/uL (ref 4.22–5.81)
RDW: 13 % (ref 11.5–15.5)
WBC: 4.6 10*3/uL (ref 4.0–10.5)
nRBC: 0 % (ref 0.0–0.2)

## 2021-08-03 NOTE — Progress Notes (Signed)
COVID Vaccine Completed:Yes Date COVID Vaccine completed: 2022 x 4 COVID vaccine manufacturer: [employer]   1 Moderna    COVID Test: 08/11/21  PCP - Dr. Halina Maidens Cardiologist -   Chest x-ray -  EKG -  Stress Test -  ECHO -  Cardiac Cath -  Pacemaker/ICD device last checked:  Sleep Study -  CPAP -   Fasting Blood Sugar -  Checks Blood Sugar _____ times a day  Blood Thinner Instructions: Aspirin Instructions: Last Dose:  Anesthesia review:   Patient denies shortness of breath, fever, cough and chest pain at PAT appointment   Patient verbalized understanding of instructions that were given to them at the PAT appointment. Patient was also instructed that they will need to review over the PAT instructions again at home before surgery.

## 2021-08-11 ENCOUNTER — Other Ambulatory Visit: Payer: Self-pay | Admitting: Surgery

## 2021-08-12 LAB — SARS CORONAVIRUS 2 (TAT 6-24 HRS): SARS Coronavirus 2: NEGATIVE

## 2021-08-12 NOTE — Anesthesia Preprocedure Evaluation (Addendum)
Anesthesia Evaluation  Patient identified by MRN, date of birth, ID band Patient awake    Reviewed: Allergy & Precautions, NPO status , Patient's Chart, lab work & pertinent test results  Airway Mallampati: I  TM Distance: >3 FB     Dental no notable dental hx.    Pulmonary asthma ,    Pulmonary exam normal        Cardiovascular negative cardio ROS   Rhythm:Regular Rate:Normal     Neuro/Psych negative neurological ROS  negative psych ROS   GI/Hepatic Neg liver ROS, Rectosigmoid mass   Endo/Other  negative endocrine ROS  Renal/GU negative Renal ROS  negative genitourinary   Musculoskeletal negative musculoskeletal ROS (+)   Abdominal Normal abdominal exam  (+)   Peds  Hematology  (+) anemia ,   Anesthesia Other Findings   Reproductive/Obstetrics                            Anesthesia Physical Anesthesia Plan  ASA: 2  Anesthesia Plan: General   Post-op Pain Management:    Induction: Intravenous  PONV Risk Score and Plan: 2 and Ondansetron, Dexamethasone, Midazolam and Treatment may vary due to age or medical condition  Airway Management Planned: Mask and Oral ETT  Additional Equipment: None  Intra-op Plan:   Post-operative Plan: Extubation in OR  Informed Consent: I have reviewed the patients History and Physical, chart, labs and discussed the procedure including the risks, benefits and alternatives for the proposed anesthesia with the patient or authorized representative who has indicated his/her understanding and acceptance.     Dental advisory given  Plan Discussed with: CRNA  Anesthesia Plan Comments: (Lab Results      Component                Value               Date                      WBC                      4.6                 08/03/2021                HGB                      13.7                08/03/2021                HCT                      42.0                 08/03/2021                MCV                      90.3                08/03/2021                PLT                      204  08/03/2021           Lab Results      Component                Value               Date                      NA                       143                 12/09/2020                K                        4.3                 12/09/2020                CO2                      23                  12/09/2020                GLUCOSE                  83                  12/09/2020                BUN                      23 (H)              12/09/2020                CREATININE               0.97                12/09/2020                CALCIUM                  9.3                 12/09/2020                EGFR                     108                 12/09/2020          )       Anesthesia Quick Evaluation

## 2021-08-13 ENCOUNTER — Encounter: Payer: Self-pay | Admitting: Internal Medicine

## 2021-08-13 ENCOUNTER — Encounter (HOSPITAL_COMMUNITY): Payer: Self-pay | Admitting: Surgery

## 2021-08-13 ENCOUNTER — Encounter (HOSPITAL_COMMUNITY)
Admission: RE | Disposition: A | Discharge status: Home / Self Care | Payer: Self-pay | Source: Home / Self Care | Attending: Surgery

## 2021-08-13 ENCOUNTER — Other Ambulatory Visit: Payer: Self-pay

## 2021-08-13 ENCOUNTER — Inpatient Hospital Stay (HOSPITAL_COMMUNITY): Payer: BC Managed Care – PPO | Admitting: Certified Registered Nurse Anesthetist

## 2021-08-13 ENCOUNTER — Inpatient Hospital Stay (HOSPITAL_COMMUNITY)
Admission: RE | Admit: 2021-08-13 | Discharge: 2021-08-15 | DRG: 983 | Disposition: A | Discharge status: Home / Self Care | Payer: BC Managed Care – PPO | Attending: Surgery | Admitting: Surgery

## 2021-08-13 DIAGNOSIS — K6389 Other specified diseases of intestine: Secondary | ICD-10-CM | POA: Diagnosis not present

## 2021-08-13 DIAGNOSIS — D3610 Benign neoplasm of peripheral nerves and autonomic nervous system, unspecified: Secondary | ICD-10-CM | POA: Diagnosis not present

## 2021-08-13 DIAGNOSIS — Z8249 Family history of ischemic heart disease and other diseases of the circulatory system: Secondary | ICD-10-CM

## 2021-08-13 DIAGNOSIS — K6289 Other specified diseases of anus and rectum: Secondary | ICD-10-CM | POA: Diagnosis not present

## 2021-08-13 DIAGNOSIS — Z880 Allergy status to penicillin: Secondary | ICD-10-CM | POA: Diagnosis not present

## 2021-08-13 DIAGNOSIS — Z9049 Acquired absence of other specified parts of digestive tract: Secondary | ICD-10-CM

## 2021-08-13 DIAGNOSIS — Z86018 Personal history of other benign neoplasm: Secondary | ICD-10-CM | POA: Insufficient documentation

## 2021-08-13 DIAGNOSIS — Z20822 Contact with and (suspected) exposure to covid-19: Secondary | ICD-10-CM | POA: Diagnosis not present

## 2021-08-13 DIAGNOSIS — D490 Neoplasm of unspecified behavior of digestive system: Secondary | ICD-10-CM | POA: Insufficient documentation

## 2021-08-13 DIAGNOSIS — D649 Anemia, unspecified: Secondary | ICD-10-CM | POA: Diagnosis present

## 2021-08-13 DIAGNOSIS — D367 Benign neoplasm of other specified sites: Secondary | ICD-10-CM | POA: Diagnosis not present

## 2021-08-13 HISTORY — PX: FLEXIBLE SIGMOIDOSCOPY: SHX5431

## 2021-08-13 LAB — ABO/RH: ABO/RH(D): B POS

## 2021-08-13 LAB — TYPE AND SCREEN
ABO/RH(D): B POS
Antibody Screen: NEGATIVE

## 2021-08-13 SURGERY — COLECTOMY, SIGMOID, ROBOT-ASSISTED
Anesthesia: General | Site: Abdomen

## 2021-08-13 MED ORDER — LIDOCAINE HCL (PF) 2 % IJ SOLN
INTRAMUSCULAR | Status: AC
  Filled 2021-08-13: qty 5

## 2021-08-13 MED ORDER — TRAMADOL HCL 50 MG PO TABS
50.0000 mg | ORAL_TABLET | Freq: Four times a day (QID) | ORAL | Status: DC | PRN
  Administered 2021-08-14: 50 mg via ORAL
  Filled 2021-08-13: qty 1

## 2021-08-13 MED ORDER — ONDANSETRON HCL 4 MG PO TABS: 4.0000 mg | ORAL_TABLET | Freq: Four times a day (QID) | ORAL | Status: DC | PRN

## 2021-08-13 MED ORDER — LIDOCAINE 2% (20 MG/ML) 5 ML SYRINGE
INTRAMUSCULAR | Status: DC | PRN
  Administered 2021-08-13: 70 mg via INTRAVENOUS

## 2021-08-13 MED ORDER — SODIUM CHLORIDE 0.9 % IV SOLN
INTRAVENOUS | Status: AC
  Filled 2021-08-13: qty 2

## 2021-08-13 MED ORDER — FENTANYL CITRATE (PF) 100 MCG/2ML IJ SOLN
INTRAMUSCULAR | Status: DC | PRN
  Administered 2021-08-13 (×5): 50 ug via INTRAVENOUS

## 2021-08-13 MED ORDER — ALBUTEROL SULFATE (2.5 MG/3ML) 0.083% IN NEBU: 2.5000 mg | INHALATION_SOLUTION | Freq: Four times a day (QID) | RESPIRATORY_TRACT | Status: DC | PRN

## 2021-08-13 MED ORDER — LACTATED RINGERS IV SOLN
INTRAVENOUS | Status: DC
  Administered 2021-08-13: 1000 mL via INTRAVENOUS

## 2021-08-13 MED ORDER — HEPARIN SODIUM (PORCINE) 5000 UNIT/ML IJ SOLN
5000.0000 [IU] | Freq: Once | INTRAMUSCULAR | Status: AC
  Administered 2021-08-13: 5000 [IU] via SUBCUTANEOUS
  Filled 2021-08-13: qty 1

## 2021-08-13 MED ORDER — CHLORHEXIDINE GLUCONATE 0.12 % MT SOLN
15.0000 mL | Freq: Once | OROMUCOSAL | Status: AC
  Administered 2021-08-13: 15 mL via OROMUCOSAL

## 2021-08-13 MED ORDER — DIPHENHYDRAMINE HCL 12.5 MG/5ML PO ELIX: 12.5000 mg | ORAL_SOLUTION | Freq: Four times a day (QID) | ORAL | Status: DC | PRN

## 2021-08-13 MED ORDER — SPY AGENT GREEN - (INDOCYANINE FOR INJECTION)
INTRAMUSCULAR | Status: DC | PRN
  Administered 2021-08-13: 3 mL via INTRAVENOUS

## 2021-08-13 MED ORDER — SUGAMMADEX SODIUM 200 MG/2ML IV SOLN
INTRAVENOUS | Status: DC | PRN
  Administered 2021-08-13: 200 mg via INTRAVENOUS

## 2021-08-13 MED ORDER — MIDAZOLAM HCL 2 MG/2ML IJ SOLN
INTRAMUSCULAR | Status: AC
  Filled 2021-08-13: qty 2

## 2021-08-13 MED ORDER — FENTANYL CITRATE (PF) 250 MCG/5ML IJ SOLN
INTRAMUSCULAR | Status: AC
  Filled 2021-08-13: qty 5

## 2021-08-13 MED ORDER — LACTATED RINGERS IV SOLN: INTRAVENOUS | Status: DC | PRN

## 2021-08-13 MED ORDER — HYDRALAZINE HCL 20 MG/ML IJ SOLN: 10.0000 mg | INTRAMUSCULAR | Status: DC | PRN

## 2021-08-13 MED ORDER — ACETAMINOPHEN 500 MG PO TABS
1000.0000 mg | ORAL_TABLET | Freq: Once | ORAL | Status: AC
  Administered 2021-08-13: 1000 mg via ORAL
  Filled 2021-08-13: qty 2

## 2021-08-13 MED ORDER — BUPIVACAINE-EPINEPHRINE (PF) 0.25% -1:200000 IJ SOLN
INTRAMUSCULAR | Status: DC | PRN
  Administered 2021-08-13: 30 mL

## 2021-08-13 MED ORDER — IBUPROFEN 200 MG PO TABS
600.0000 mg | ORAL_TABLET | Freq: Four times a day (QID) | ORAL | Status: DC | PRN
  Administered 2021-08-13 – 2021-08-15 (×3): 600 mg via ORAL
  Filled 2021-08-13 (×3): qty 3

## 2021-08-13 MED ORDER — ACETAMINOPHEN 500 MG PO TABS
1000.0000 mg | ORAL_TABLET | Freq: Four times a day (QID) | ORAL | Status: DC
  Administered 2021-08-13 – 2021-08-15 (×7): 1000 mg via ORAL
  Filled 2021-08-13 (×8): qty 2

## 2021-08-13 MED ORDER — MIDAZOLAM HCL 5 MG/5ML IJ SOLN
INTRAMUSCULAR | Status: DC | PRN
  Administered 2021-08-13: 2 mg via INTRAVENOUS

## 2021-08-13 MED ORDER — SIMETHICONE 80 MG PO CHEW: 40.0000 mg | CHEWABLE_TABLET | Freq: Four times a day (QID) | ORAL | Status: DC | PRN

## 2021-08-13 MED ORDER — GLYCOPYRROLATE 0.2 MG/ML IJ SOLN
INTRAMUSCULAR | Status: AC
  Filled 2021-08-13: qty 1

## 2021-08-13 MED ORDER — DEXAMETHASONE SODIUM PHOSPHATE 10 MG/ML IJ SOLN
INTRAMUSCULAR | Status: DC | PRN
  Administered 2021-08-13: 8 mg via INTRAVENOUS

## 2021-08-13 MED ORDER — LACTATED RINGERS IR SOLN
Status: DC | PRN
  Administered 2021-08-13: 1000 mL

## 2021-08-13 MED ORDER — TRAMADOL HCL 50 MG PO TABS
50.0000 mg | ORAL_TABLET | Freq: Four times a day (QID) | ORAL | 0 refills | Status: AC | PRN
  Filled 2021-08-13: qty 20, 5d supply, fill #0

## 2021-08-13 MED ORDER — ENSURE SURGERY PO LIQD
237.0000 mL | Freq: Two times a day (BID) | ORAL | Status: DC
  Administered 2021-08-13 – 2021-08-15 (×5): 237 mL via ORAL

## 2021-08-13 MED ORDER — LIDOCAINE 20MG/ML (2%) 15 ML SYRINGE OPTIME
INTRAMUSCULAR | Status: DC | PRN
  Administered 2021-08-13: 1.5 mg/kg/h via INTRAVENOUS

## 2021-08-13 MED ORDER — SODIUM CHLORIDE 0.9 % IV SOLN
2.0000 g | Freq: Once | INTRAVENOUS | Status: AC
  Administered 2021-08-13: 2 g via INTRAVENOUS

## 2021-08-13 MED ORDER — ALVIMOPAN 12 MG PO CAPS
12.0000 mg | ORAL_CAPSULE | Freq: Two times a day (BID) | ORAL | Status: DC
  Administered 2021-08-14: 12 mg via ORAL
  Filled 2021-08-13 (×2): qty 1

## 2021-08-13 MED ORDER — BUPIVACAINE LIPOSOME 1.3 % IJ SUSP
20.0000 mL | Freq: Once | INTRAMUSCULAR | Status: AC
  Administered 2021-08-13: 20 mL
  Filled 2021-08-13: qty 20

## 2021-08-13 MED ORDER — ALVIMOPAN 12 MG PO CAPS
12.0000 mg | ORAL_CAPSULE | ORAL | Status: AC
  Administered 2021-08-13: 12 mg via ORAL
  Filled 2021-08-13: qty 1

## 2021-08-13 MED ORDER — ALUM & MAG HYDROXIDE-SIMETH 200-200-20 MG/5ML PO SUSP: 30.0000 mL | Freq: Four times a day (QID) | ORAL | Status: DC | PRN

## 2021-08-13 MED ORDER — ROCURONIUM BROMIDE 10 MG/ML (PF) SYRINGE
PREFILLED_SYRINGE | INTRAVENOUS | Status: AC
  Filled 2021-08-13: qty 10

## 2021-08-13 MED ORDER — FENTANYL CITRATE PF 50 MCG/ML IJ SOSY
PREFILLED_SYRINGE | INTRAMUSCULAR | Status: AC
  Filled 2021-08-13: qty 3

## 2021-08-13 MED ORDER — ONDANSETRON HCL 4 MG/2ML IJ SOLN
INTRAMUSCULAR | Status: AC
  Filled 2021-08-13: qty 2

## 2021-08-13 MED ORDER — DEXAMETHASONE SODIUM PHOSPHATE 10 MG/ML IJ SOLN
INTRAMUSCULAR | Status: AC
  Filled 2021-08-13: qty 1

## 2021-08-13 MED ORDER — PROMETHAZINE HCL 25 MG/ML IJ SOLN: 6.2500 mg | INTRAMUSCULAR | Status: DC | PRN

## 2021-08-13 MED ORDER — BUPIVACAINE-EPINEPHRINE (PF) 0.25% -1:200000 IJ SOLN
INTRAMUSCULAR | Status: AC
  Filled 2021-08-13: qty 30

## 2021-08-13 MED ORDER — FENTANYL CITRATE PF 50 MCG/ML IJ SOSY
25.0000 ug | PREFILLED_SYRINGE | INTRAMUSCULAR | Status: DC | PRN
  Administered 2021-08-13 (×3): 50 ug via INTRAVENOUS

## 2021-08-13 MED ORDER — PROPOFOL 10 MG/ML IV BOLUS
INTRAVENOUS | Status: DC | PRN
  Administered 2021-08-13: 200 mg via INTRAVENOUS

## 2021-08-13 MED ORDER — GLYCOPYRROLATE PF 0.2 MG/ML IJ SOSY
PREFILLED_SYRINGE | INTRAMUSCULAR | Status: DC | PRN
  Administered 2021-08-13: .2 mg via INTRAVENOUS

## 2021-08-13 MED ORDER — HEPARIN SODIUM (PORCINE) 5000 UNIT/ML IJ SOLN
5000.0000 [IU] | Freq: Three times a day (TID) | INTRAMUSCULAR | Status: DC
  Administered 2021-08-13 – 2021-08-15 (×7): 5000 [IU] via SUBCUTANEOUS
  Filled 2021-08-13 (×7): qty 1

## 2021-08-13 MED ORDER — 0.9 % SODIUM CHLORIDE (POUR BTL) OPTIME
TOPICAL | Status: DC | PRN
  Administered 2021-08-13: 2000 mL

## 2021-08-13 MED ORDER — LACTATED RINGERS IV SOLN: INTRAVENOUS | Status: DC

## 2021-08-13 MED ORDER — DIPHENHYDRAMINE HCL 50 MG/ML IJ SOLN: 12.5000 mg | Freq: Four times a day (QID) | INTRAMUSCULAR | Status: DC | PRN

## 2021-08-13 MED ORDER — LORATADINE 10 MG PO TABS
10.0000 mg | ORAL_TABLET | Freq: Every day | ORAL | Status: DC
  Administered 2021-08-15: 10 mg via ORAL
  Filled 2021-08-13 (×2): qty 1

## 2021-08-13 MED ORDER — KETAMINE HCL 10 MG/ML IJ SOLN
INTRAMUSCULAR | Status: DC | PRN
  Administered 2021-08-13: 40 mg via INTRAVENOUS

## 2021-08-13 MED ORDER — MAGNESIUM OXIDE -MG SUPPLEMENT 400 (240 MG) MG PO TABS
400.0000 mg | ORAL_TABLET | Freq: Every day | ORAL | Status: DC
  Administered 2021-08-14 – 2021-08-15 (×2): 400 mg via ORAL
  Filled 2021-08-13 (×2): qty 1

## 2021-08-13 MED ORDER — LIDOCAINE HCL 2 % IJ SOLN
INTRAMUSCULAR | Status: AC
  Filled 2021-08-13: qty 20

## 2021-08-13 MED ORDER — ONDANSETRON HCL 4 MG/2ML IJ SOLN
4.0000 mg | Freq: Four times a day (QID) | INTRAMUSCULAR | Status: DC | PRN
  Administered 2021-08-13: 4 mg via INTRAVENOUS
  Filled 2021-08-13: qty 2

## 2021-08-13 MED ORDER — ONDANSETRON HCL 4 MG/2ML IJ SOLN
INTRAMUSCULAR | Status: DC | PRN
  Administered 2021-08-13: 4 mg via INTRAVENOUS

## 2021-08-13 MED ORDER — MELATONIN 3 MG PO TABS: 3.0000 mg | ORAL_TABLET | Freq: Every evening | ORAL | Status: DC | PRN

## 2021-08-13 MED ORDER — KETAMINE HCL 10 MG/ML IJ SOLN
INTRAMUSCULAR | Status: AC
  Filled 2021-08-13: qty 1

## 2021-08-13 MED ORDER — PROPOFOL 10 MG/ML IV BOLUS
INTRAVENOUS | Status: AC
  Filled 2021-08-13: qty 20

## 2021-08-13 MED ORDER — ORAL CARE MOUTH RINSE: 15.0000 mL | Freq: Once | OROMUCOSAL | Status: AC

## 2021-08-13 MED ORDER — ROCURONIUM BROMIDE 10 MG/ML (PF) SYRINGE
PREFILLED_SYRINGE | INTRAVENOUS | Status: DC | PRN
  Administered 2021-08-13: 70 mg via INTRAVENOUS
  Administered 2021-08-13 (×2): 10 mg via INTRAVENOUS

## 2021-08-13 MED ORDER — HYDROMORPHONE HCL 1 MG/ML IJ SOLN: 0.5000 mg | INTRAMUSCULAR | Status: DC | PRN

## 2021-08-13 SURGICAL SUPPLY — 109 items
APPLIER CLIP 5 13 M/L LIGAMAX5 (MISCELLANEOUS)
APPLIER CLIP ROT 10 11.4 M/L (STAPLE)
BAG COUNTER SPONGE SURGICOUNT (BAG) IMPLANT
BLADE EXTENDED COATED 6.5IN (ELECTRODE) ×3 IMPLANT
CANNULA REDUC XI 12-8 STAPL (CANNULA)
CANNULA REDUCER 12-8 DVNC XI (CANNULA) IMPLANT
CELLS DAT CNTRL 66122 CELL SVR (MISCELLANEOUS) IMPLANT
CHLORAPREP W/TINT 26 (MISCELLANEOUS) IMPLANT
CLIP APPLIE 5 13 M/L LIGAMAX5 (MISCELLANEOUS) IMPLANT
CLIP APPLIE ROT 10 11.4 M/L (STAPLE) IMPLANT
CLIP LIGATING HEM O LOK PURPLE (MISCELLANEOUS) IMPLANT
CLIP LIGATING HEMO O LOK GREEN (MISCELLANEOUS) IMPLANT
COVER SURGICAL LIGHT HANDLE (MISCELLANEOUS) ×6 IMPLANT
COVER TIP SHEARS 8 DVNC (MISCELLANEOUS) ×2 IMPLANT
COVER TIP SHEARS 8MM DA VINCI (MISCELLANEOUS) ×1
DECANTER SPIKE VIAL GLASS SM (MISCELLANEOUS) IMPLANT
DEFOGGER SCOPE WARMER CLEARIFY (MISCELLANEOUS) ×3 IMPLANT
DEVICE TROCAR PUNCTURE CLOSURE (ENDOMECHANICALS) IMPLANT
DRAIN CHANNEL 19F RND (DRAIN) IMPLANT
DRAPE ARM DVNC X/XI (DISPOSABLE) ×8 IMPLANT
DRAPE COLUMN DVNC XI (DISPOSABLE) ×2 IMPLANT
DRAPE DA VINCI XI ARM (DISPOSABLE) ×4
DRAPE DA VINCI XI COLUMN (DISPOSABLE) ×1
DRAPE SURG IRRIG POUCH 19X23 (DRAPES) ×3 IMPLANT
DRSG OPSITE POSTOP 4X10 (GAUZE/BANDAGES/DRESSINGS) IMPLANT
DRSG OPSITE POSTOP 4X6 (GAUZE/BANDAGES/DRESSINGS) ×3 IMPLANT
DRSG OPSITE POSTOP 4X8 (GAUZE/BANDAGES/DRESSINGS) IMPLANT
DRSG TEGADERM 2-3/8X2-3/4 SM (GAUZE/BANDAGES/DRESSINGS) IMPLANT
DRSG TEGADERM 4X4.75 (GAUZE/BANDAGES/DRESSINGS) IMPLANT
ELECT REM PT RETURN 15FT ADLT (MISCELLANEOUS) ×3 IMPLANT
ENDOLOOP SUT PDS II  0 18 (SUTURE)
ENDOLOOP SUT PDS II 0 18 (SUTURE) IMPLANT
EVACUATOR SILICONE 100CC (DRAIN) IMPLANT
GAUZE SPONGE 2X2 8PLY STRL LF (GAUZE/BANDAGES/DRESSINGS) IMPLANT
GAUZE SPONGE 4X4 12PLY STRL (GAUZE/BANDAGES/DRESSINGS) IMPLANT
GLOVE SURG ENC MOIS LTX SZ7.5 (GLOVE) ×9 IMPLANT
GLOVE SURG UNDER LTX SZ8 (GLOVE) ×9 IMPLANT
GOWN STRL REUS W/TWL XL LVL3 (GOWN DISPOSABLE) ×15 IMPLANT
GRASPER SUT TROCAR 14GX15 (MISCELLANEOUS) IMPLANT
HOLDER FOLEY CATH W/STRAP (MISCELLANEOUS) ×3 IMPLANT
IRRIG SUCT STRYKERFLOW 2 WTIP (MISCELLANEOUS) ×3
IRRIGATION SUCT STRKRFLW 2 WTP (MISCELLANEOUS) ×2 IMPLANT
KIT PROCEDURE DA VINCI SI (MISCELLANEOUS)
KIT PROCEDURE DVNC SI (MISCELLANEOUS) IMPLANT
KIT TURNOVER KIT A (KITS) ×3 IMPLANT
NEEDLE INSUFFLATION 14GA 120MM (NEEDLE) ×3 IMPLANT
PACK CARDIOVASCULAR III (CUSTOM PROCEDURE TRAY) ×3 IMPLANT
PACK COLON (CUSTOM PROCEDURE TRAY) ×3 IMPLANT
PAD POSITIONING PINK XL (MISCELLANEOUS) ×3 IMPLANT
PENCIL SMOKE EVACUATOR (MISCELLANEOUS) ×3 IMPLANT
PROTECTOR NERVE ULNAR (MISCELLANEOUS) ×6 IMPLANT
RELOAD STAPLER 3.5X45 BLU DVNC (STAPLE) IMPLANT
RELOAD STAPLER 3.5X60 BLU DVNC (STAPLE) IMPLANT
RELOAD STAPLER 4.3X45 GRN DVNC (STAPLE) IMPLANT
RELOAD STAPLER 4.3X60 GRN DVNC (STAPLE) ×2 IMPLANT
RTRCTR WOUND ALEXIS 18CM MED (MISCELLANEOUS)
SCISSORS LAP 5X35 DISP (ENDOMECHANICALS) IMPLANT
SEAL CANN UNIV 5-8 DVNC XI (MISCELLANEOUS) ×8 IMPLANT
SEAL XI 5MM-8MM UNIVERSAL (MISCELLANEOUS) ×4
SEALER VESSEL DA VINCI XI (MISCELLANEOUS) ×1
SEALER VESSEL EXT DVNC XI (MISCELLANEOUS) ×2 IMPLANT
SLEEVE ADV FIXATION 5X100MM (TROCAR) IMPLANT
SOLUTION ELECTROLUBE (MISCELLANEOUS) ×3 IMPLANT
SPONGE GAUZE 2X2 STER 10/PKG (GAUZE/BANDAGES/DRESSINGS)
STAPLER 60 DA VINCI SURE FORM (STAPLE) ×1
STAPLER 60 SUREFORM DVNC (STAPLE) ×2 IMPLANT
STAPLER CANNULA SEAL DVNC XI (STAPLE) ×2 IMPLANT
STAPLER CANNULA SEAL XI (STAPLE) ×1
STAPLER ECHELON POWER CIR 29 (STAPLE) IMPLANT
STAPLER ECHELON POWER CIR 31 (STAPLE) IMPLANT
STAPLER RELOAD 3.5X45 BLU DVNC (STAPLE)
STAPLER RELOAD 3.5X45 BLUE (STAPLE)
STAPLER RELOAD 3.5X60 BLU DVNC (STAPLE)
STAPLER RELOAD 3.5X60 BLUE (STAPLE)
STAPLER RELOAD 4.3X45 GREEN (STAPLE)
STAPLER RELOAD 4.3X45 GRN DVNC (STAPLE)
STAPLER RELOAD 4.3X60 GREEN (STAPLE) ×1
STAPLER RELOAD 4.3X60 GRN DVNC (STAPLE) ×2
STOPCOCK 4 WAY LG BORE MALE ST (IV SETS) IMPLANT
SURGILUBE 2OZ TUBE FLIPTOP (MISCELLANEOUS) ×3 IMPLANT
SUT MNCRL AB 4-0 PS2 18 (SUTURE) ×3 IMPLANT
SUT PDS AB 1 CT1 27 (SUTURE) ×6 IMPLANT
SUT PDS AB 1 TP1 96 (SUTURE) IMPLANT
SUT PROLENE 0 CT 2 (SUTURE) IMPLANT
SUT PROLENE 2 0 KS (SUTURE) ×3 IMPLANT
SUT PROLENE 2 0 SH DA (SUTURE) IMPLANT
SUT SILK 2 0 (SUTURE)
SUT SILK 2 0 SH CR/8 (SUTURE) IMPLANT
SUT SILK 2-0 18XBRD TIE 12 (SUTURE) IMPLANT
SUT SILK 3 0 (SUTURE) ×1
SUT SILK 3 0 SH CR/8 (SUTURE) ×3 IMPLANT
SUT SILK 3-0 18XBRD TIE 12 (SUTURE) ×2 IMPLANT
SUT V-LOC BARB 180 2/0GR6 GS22 (SUTURE)
SUT VIC AB 3-0 SH 18 (SUTURE) IMPLANT
SUT VIC AB 3-0 SH 27 (SUTURE)
SUT VIC AB 3-0 SH 27XBRD (SUTURE) IMPLANT
SUT VICRYL 0 UR6 27IN ABS (SUTURE) ×3 IMPLANT
SUTURE V-LC BRB 180 2/0GR6GS22 (SUTURE) IMPLANT
SYR 10ML LL (SYRINGE) ×3 IMPLANT
SYS LAPSCP GELPORT 120MM (MISCELLANEOUS)
SYS WOUND ALEXIS 18CM MED (MISCELLANEOUS) ×3
SYSTEM LAPSCP GELPORT 120MM (MISCELLANEOUS) IMPLANT
SYSTEM WOUND ALEXIS 18CM MED (MISCELLANEOUS) ×2 IMPLANT
TAPE UMBILICAL 1/8 X36 TWILL (MISCELLANEOUS) IMPLANT
TOWEL OR NON WOVEN STRL DISP B (DISPOSABLE) ×3 IMPLANT
TRAY FOLEY MTR SLVR 16FR STAT (SET/KITS/TRAYS/PACK) ×3 IMPLANT
TROCAR ADV FIXATION 5X100MM (TROCAR) ×3 IMPLANT
TUBING CONNECTING 10 (TUBING) ×6 IMPLANT
TUBING INSUFFLATION 10FT LAP (TUBING) ×3 IMPLANT

## 2021-08-13 NOTE — H&P (Signed)
CC: Here today for surgery  HPI: Nathaniel Pratt is an 30 y.o. male with history of anemia, whom is seen in the office today as a referral by Dr. Vicente Males for evaluation of mass in rectosigmoid colon.  He had initially presented for a well check with his primary care and was found to have some degree of anemia. He was given iron supplementation reports this improved. He was subsequently seen by gastroenterology. He did have a history of some degree of headaches where he would take Excedrin 2 or 3 times a week. He was advised to discontinue this and subsequently scheduled for a colonoscopy.  Colonoscopy 04/07/21 Dr. Vicente Males -mass in the rectosigmoid colon  Lower EUS/Bx 04/29/21 Dr. Ardis Hughs - 2.3 cm endophytic, hypoechoic, homogeneous subepithelial lesion that communicates with the muscularis propria 7 cm from the anal verge. 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  CT A/P 04/27/21 -2.4 cm rectal mass that is not characteristic for a lipoma based on the Hounsfield units. This has a broad base and is measured to be 2.4 x 2.3 x 2.1 cm. On the CT scan, this does appear to be on the left anterior lateral side of the rectosigmoid colon. No suspicious lymphadenopathy or evidence of metastatic disease.  He denies any complaints today. He denies any bloating, abdominal pain, blood in his stool. He denies any rectal or low back pain. He denies any history of prolapsing tissue from anus.  PMH: Migraines  PSH: Nasal fracture surgery, thumb surgery. He denies any prior abdominal or pelvic surgical history.  FHx: Denies any known family history of colorectal, breast, endometrial or ovarian cancer  Social Hx: Denies use of tobacco/EtOH/illicit drug. 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  Past Medical History:  Diagnosis Date   Allergy    Anemia     Asthma    Broken nose 2008   Family history of breast cancer    Thumb fracture 02/2009    Past Surgical History:  Procedure Laterality Date   COLONOSCOPY WITH PROPOFOL N/A 04/07/2021   Procedure: COLONOSCOPY WITH PROPOFOL;  Surgeon: Jonathon Bellows, MD;  Location: [facility] ENDOSCOPY;  Service: Gastroenterology;  Laterality: N/A;   ESOPHAGOGASTRODUODENOSCOPY (EGD) WITH PROPOFOL N/A 04/07/2021   Procedure: ESOPHAGOGASTRODUODENOSCOPY (EGD) WITH PROPOFOL;  Surgeon: Jonathon Bellows, MD;  Location: [facility] ENDOSCOPY;  Service: Gastroenterology;  Laterality: N/A;   EUS N/A 04/29/2021   Procedure: LOWER ENDOSCOPIC ULTRASOUND (EUS);  Surgeon: Milus Banister, MD;  Location: [facility] ENDOSCOPY;  Service: Endoscopy;  Laterality: N/A;   FINE NEEDLE ASPIRATION N/A 04/29/2021   Procedure: FINE NEEDLE ASPIRATION (FNA) LINEAR;  Surgeon: Milus Banister, MD;  Location: [facility] ENDOSCOPY;  Service: Endoscopy;  Laterality: N/A;   FLEXIBLE SIGMOIDOSCOPY N/A 04/29/2021   Procedure: FLEXIBLE SIGMOIDOSCOPY;  Surgeon: Milus Banister, MD;  Location: [facility] ENDOSCOPY;  Service: Endoscopy;  Laterality: N/A;   ORIF FINGER / THUMB FRACTURE     RHINOPLASTY  2008    Family History  Problem Relation Age of Onset   Hypertension Mother    Allergies Mother    Hypertension Father    Bleeding Disorder Maternal Grandmother    Heart attack Maternal Grandfather    Breast cancer Paternal Grandmother    Clotting disorder Paternal Grandfather     Social:  reports that he has never smoked. He has never used smokeless tobacco. He reports that he does not currently  use alcohol. He reports that he does not use drugs.  Allergies:  Allergies  Allergen Reactions   Amoxicillin Hives   Penicillins Hives and Itching    Reaction: 18 years    Medications: I have reviewed the patient's current medications.  No results found for this or any previous visit (from the past 48 hour(s)).  No results found.  ROS - all of the below systems have been reviewed with  the patient and positives are indicated with bold text General: chills, fever or night sweats Eyes: blurry vision or double vision ENT: epistaxis or sore throat Allergy/Immunology: itchy/watery eyes or nasal congestion Hematologic/Lymphatic: bleeding problems, blood clots or swollen lymph nodes Endocrine: temperature intolerance or unexpected weight changes Breast: new or changing breast lumps or nipple discharge Resp: cough, shortness of breath, or wheezing CV: chest pain or dyspnea on exertion GI: as per HPI GU: dysuria, trouble voiding, or hematuria MSK: joint pain or joint stiffness Neuro: TIA or stroke symptoms Derm: pruritus and skin lesion changes Psych: anxiety and depression  PE Height 6' (1.829 m), weight 86.2 kg. Constitutional: NAD; conversant; no deformities Eyes: Moist conjunctiva; no lid lag; anicteric; PERRL Neck: Trachea midline; no thyromegaly Lungs: Normal respiratory effort; no tactile fremitus CV: RRR; no palpable thrills; no pitting edema GI: Abd soft, NT/ND; no palpable hepatosplenomegaly MSK: Normal range of motion of extremities; no clubbing/cyanosis Psychiatric: Appropriate affect; alert and oriented x3 Lymphatic: No palpable cervical or axillary lymphadenopathy  No results found for this or any previous visit (from the past 48 hour(s)).  No results found.   A/P: Nathaniel Pratt is an 30 y.o. 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 The pathophysiology of rectal polyps, masses and even cancer was discussed as well with associated pictures. -We have discussed various different treatment options going forward including surgery (the most definitive) to address this - robotic low anterior resection, flexible sigmoidoscopy -Given the more apparent proximal location of this mass, being above the  peritoneal reflection and on EUS involving the muscularis propria, any attempts at transanal excision will likely result in a fairly large defect communicating with the peritoneal cavity. Additionally, not having a definitive pathologic diagnosis at this juncture. We therefore discussed proceeding with a definitive resection - robotic low anterior resection, flexible sigmoidoscopy (concurrently for localization). -The planned procedure, material risks (including, but not limited to, pain, bleeding, infection, scarring, need for blood transfusion, damage to surrounding structures- blood vessels/nerves/viscus/organs, damage to ureter, urine leak, leak from anastomosis, need for additional procedures, sexual dysfunction, scenarios where a stoma may be necessary and where it may be permanent, worsening of pre-existing medical conditions, hernia, recurrence, pneumonia, heart attack, stroke, death) benefits and alternatives to surgery were discussed at length. The patient's questions were answered to his satisfaction, he voiced understanding and elected to proceed with surgery. Additionally, we discussed typical postoperative expectations and the recovery process.  Nadeen Landau, MD [facility] Surgery Use [email or web address] to contact on call provider

## 2021-08-13 NOTE — Transfer of Care (Signed)
Immediate Anesthesia Transfer of Care Note  Patient: Nathaniel Pratt  Procedure(s) Performed: XI ROBOT ASSISTED LOW ANTERIOR RESECTION WITH BILATERAL TAP BLOCK AND INTRAOPERATIVE ASSESSMENT OF PERFUSION USING FIREFLY (Abdomen) DIAGNOSTIC FLEXIBLE SIGMOIDOSCOPY  Patient Location: PACU  Anesthesia Type:General  Level of Consciousness: drowsy and patient cooperative  Airway & Oxygen Therapy: Patient Spontanous Breathing and Patient connected to face mask oxygen  Post-op Assessment: Report given to RN and Post -op Vital signs reviewed and stable  Post vital signs: Reviewed and stable  Last Vitals:  Vitals Value Taken Time  BP 110/52 08/13/21 1048  Temp    Pulse 94 08/13/21 1050  Resp 15 08/13/21 1050  SpO2 100 % 08/13/21 1050  Vitals shown include unvalidated device data.  Last Pain:  Vitals:   08/13/21 0546  PainSc: 0-No pain      Patients Stated Pain Goal: 4 (16/10/96 0454)  Complications: No notable events documented.

## 2021-08-13 NOTE — Anesthesia Postprocedure Evaluation (Signed)
Anesthesia Post Note  Patient: Nathaniel Pratt  Procedure(s) Performed: XI ROBOT ASSISTED LOW ANTERIOR RESECTION WITH BILATERAL TAP BLOCK AND INTRAOPERATIVE ASSESSMENT OF PERFUSION USING FIREFLY (Abdomen) DIAGNOSTIC FLEXIBLE SIGMOIDOSCOPY     Patient location during evaluation: PACU Anesthesia Type: General Level of consciousness: awake and alert Pain management: pain level controlled Vital Signs Assessment: post-procedure vital signs reviewed and stable Respiratory status: spontaneous breathing, nonlabored ventilation, respiratory function stable and patient connected to nasal cannula oxygen Cardiovascular status: blood pressure returned to baseline and stable Postop Assessment: no apparent nausea or vomiting Anesthetic complications: no   No notable events documented.  Last Vitals:  Vitals:   08/13/21 1525 08/13/21 1616  BP: 112/68 132/79  Pulse: 92 88  Resp: 17 17  Temp: 36.9 C 36.9 C  SpO2: 97% (!) 84%    Last Pain:  Vitals:   08/13/21 1616  TempSrc: Oral  PainSc:                  March Rummage Lydie Stammen

## 2021-08-13 NOTE — Op Note (Signed)
PATIENT: Nathaniel Pratt  30 y.o. male  Patient Care Team: Glean Hess, MD as PCP - General (Internal Medicine)  PREOP DIAGNOSIS: RECTOSIGMOID MASS  POSTOP DIAGNOSIS: RECTOSIGMOID MASS  PROCEDURE: Robotic-assisted low anterior resection Diagnostic flexible sigmoidoscopy (to localize lesion) Intraoperative assessment of perfusion with ICG Bilateral transversus abdominus plane (TAP) blocks  SURGEON: Sharon Mt. Inman Fettig, MD  ASSISTANT: Leighton Ruff, MD  ANESTHESIA: General endotracheal  EBL: 20 mL Total I/O In: 2700 [I.V.:2600; IV Piggyback:100] Out: 120 [Urine:100; Blood:20]  DRAINS: None  SPECIMEN: Rectosigmoid colon - open end proximal  COUNTS: Sponge, needle and instrument counts were reported correct x2  FINDINGS: Mass palpable on rectal exam.  When we did flexible sigmoidoscopy, this does appear to be pulling down the rectosigmoid colon but originating higher than it is actually felt.  No full-thickness extension that was grossly apparent during the procedure.  A low anterior resection was carried out.  A double stapled airtight, hemostatic, tension-free, well perfused colorectal anastomosis was fashioned 10 cm from the anal verge by flexible sigmoidoscopy.  NARRATIVE: Informed consent was verified. He was taken to the operating room, placed supine on the operating table and SCD's were applied. General endotracheal anesthesia was induced without difficulty. He was then positioned in the lithotomy position with Allen stirrups.  Pressure points were evaluated and padded.  A foley catheter was then placed by nursing under sterile conditions. Hair on the abdomen was clipped.  He was secured to the operating table. The abdomen was then prepped and draped in the standard sterile fashion. Surgical timeout was called indicating the correct patient, procedure, positioning and need for preoperative antibiotics.   An OG tube was placed by anesthesia and confirmed to be to  suction.  At Palmer's point, a stab incision was created and the Veress needle was introduced into the peritoneal cavity on the first attempt.  Intraperitoneal location was confirmed by the aspiration and saline drop test.  Pneumoperitoneum was established to a maximum pressure of 15 mmHg using CO2.  Following this, the abdomen was marked for planned trocar sites.  Just to the right and cephalad to the umbilicus, an 8 mm incision was created and an 8 mm blunt tipped robotic trocar was cautiously placed into the peritoneal cavity.  The laparoscope was inserted and demonstrated no evidence of trocar site nor Veress needle site complications.  The Veress needle was removed.  Bilateral transversus abdominis plane blocks were then created using a dilute mixture of Exparel with Marcaine.  3 additional 8 mm robotic trochars were placed under direct visualization roughly in a line extending from the right ASIS towards the left upper quadrant. The bladder was inspected and noted to be at/below the pubic symphysis.  Staying 3 fingerbreadths above the pubic symphysis, an incision was created and the 12 mm robotic trocar inserted directed cephalad into the peritoneal cavity under direct visualization.  An additional 5 mm assist port was placed in the right lateral abdomen under direct visualization.  The abdomen was surveyed and there was normal-appearing peritoneal surfaces and a normal-appearing liver.  He was positioned in Trendelenburg with some left side up.  Small bowel was carefully retracted out of the pelvis.  The robot was then docked and I went to the console.   The sigmoid colon was readily identified.  Attachments of the sigmoid colon were taken down from the intersigmoid fossa.  The rectosigmoid colon was grasped and elevated anteriorly. Beginning with a medial to lateral approach, the peritoneum overlying the presacral  space was carefully incised.  The TME plane was readily gained working in a plane between the  fascia propria of the rectum and the presacral fascia.  Hypogastric nerves were seen going along the the presacral fascia and were protected free of injury.  Working more proximally, the mesorectum and sigmoid mesentery were carefully mobilized off of the peritoneum.  The left ureter was identified and protected free of injury.  The left gonadal vessels were identified and protected.  These were both swept "down."  The superior hemorrhoidal and IMA pedicles were identified. Further mesocolon was mobilized proximally staying in this plane between the retroperitoneum proper and the mesocolon. Attention was then turned to the lateral portion of dissection.  The sigmoid colon was retracted to the right.  The sigmoid colon was fully mobilized and working proximal to the diseased segment of colon, the descending colon was mobilized by incising the Kindrick Lankford line of [place].  The associated mesocolon was also mobilized medially.  The left ureter again was confirmed to be well away for plan dissection and well away from the vasculature which had been dissected medially.  The rectosigmoid colon was elevated anteriorly. The left ureter was re-identified. The IMA was clear of this. The IMA was then divided with the vessel sealer. The stump was inspected and noted to be completely hemostatic with a good seal.  The mesentery was divided out to the point of planned proximal division.  The masses soft and does not appear to extend through the wall.  It does appear quite mobile.  To confirm the fracture looking at the mass, I opted to perform a diagnostic flexible sigmoidoscopy.  The colon proximal to the rectosigmoid colon was gently clamped.  I then went below to pass the flexible sigmoidoscope.  This was introduced through the anus and into the rectum.  With adequate distention, this submucosal mass does appear to be prolapsing down into his rectum but originates from the upper 1/3 of the rectum.  Therefore, it was apparent we would  need to do a low anterior resection for an adequate distal margin.  The rectum was then decompressed.  I went back to the console.  Working well below this mass, approximately 5 cm distal to it, the mesorectum is circumferentially cleared.  Using the vessel sealer. Anatomically, this clearly represents the proximal rectum. The distal point of transection on the mid/upper rectum was identified.   A 60 mm green load robotic stapler was then placed through the 12 mm port and introduced into the peritoneal cavity.  The rectum was divided with 2 flush firings of the stapler.  The stump was intact and healthy in appearance.  The proximal point of transection was identified including the IMA pedicle on the mesenteric portion of the colon.  This is approximately the level of the mid sigmoid colon.  The mesentery was cleared out to the level of planned proximal division which again includes the IMA proper.  The marginal artery has a visible pulse going out to the clear level of the colon.   Attention was turned to performing a perfusion test. ICG was administered by anesthesia and at the level of the cleared mesentery proximally, there was excellent uptake of the tracer.  The rectum was also well perfused in appearance.   This colon is also supple and healthy in appearance without any thickening.  This reached into the pelvis without any difficulty and remained in that location without any tension. A locking grasper was then placed on the  sigmoid staple line.   Attention was turned to the extracorporeal portion of the procedure.  The robot was undocked.  I scrubbed back in.  Using the 12 mm trocar site, a Pfannenstiel incision was created and incorporated the fascial opening through the 12 mm port site.  The rectus fascia was incised and then elevated.  The rectus muscle was mobilized free of the overlying fascia.  The peritoneum was incised in the midline well above the location of the bladder.  An [place] wound  protector was placed.  Towels were placed around the field.  The divided colon was passed through the wound protector.  The point of proximal division was identified and was again on a healthy segment of supple colon with a palpable pulse in the mesentery. This was pink in color.  A pursestring device was applied.  A 2-0 Prolene on a Keith needle was passed.  The colon was divided and passed off with the open end being proximal.  EEA sizers were then introduced and a 29 mm EEA selected.  "Belt loops" consisting of 3-0 silk were placed around the pursestring suture line.  The anvil was placed and the pursestring tied.  A small amount of fat was cleared from the planned anastomosis and no diverticula were apparent within this.  This was placed back into the abdomen and a cap placed over the wound protector port site.  Pneumoperitoneum was reestablished.  I then went below to pass the stapler.  My partner remained above.  EEA sizers were cautiously passed trans-anally under direct visualization.  The stapler was passed and the spike deployed just anterior to the staple line.  The components were then mated.  Orientation was confirmed such that there is no twisting of the colon nor small bowel underneath the mesenteric defect. Care was taken to ensure no other structures were incorporated within this either.  The stapler was then closed, held, and fired. This was then removed. The donuts were inspected and noted to be complete.  The colon proximal to the anastomosis was then gently occluded. The pelvis was filled with sterile irrigation. Under direct visualization, I passed a flexible sigmoidoscope.  The anastomosis was under water.  With good distention of the anastomosis there was no air leak. The anastomosis pink in appearance.  This is located at 10 cm from the anal verge by flexible sigmoidoscopy.  It is hemostatic.  Additionally, looking from above, there is no tension on the colon or mesentery.  Sigmoidoscope was  withdrawn.  Irrigation was evacuated from the pelvis.  The abdomen and pelvis are surveyed and noted to be completely hemostatic without any apparent injury.  Under direct visualization, all trochars are removed.  The [place] wound protector was removed.  Gowns/gloves are changed and a fresh set of clean instruments utilized. Additional sterile drapes were placed around the field.   Sponge, needle, and instrument counts were reported correct.  The Pfannenstiel peritoneum was closed with a running 2-0 Vicryl suture.  The rectus fascia was then closed using 2 running #1 PDS sutures.  The fascia was then palpated and noted to be completely closed.  Additional anesthetic was infiltrated at the Pfannenstiel site.  Sponge, needle, and instrument counts were then again reported correct. 4-0 Monocryl subcuticular suture was used to close the skin of all incision sites.  Dermabond was placed over all incisions.  A honeycomb dressing placed over the Pfannenstiel as well.   He is then taken out of lithotomy, awakened from anesthesia, extubated,  and transferred to a stretcher for transport to PACU in satisfactory condition having tolerated the procedure well.

## 2021-08-13 NOTE — Discharge Instructions (Signed)
POST OP INSTRUCTIONS AFTER COLON SURGERY  DIET: Be sure to include lots of fluids daily to stay hydrated - 64oz of water per day (8, 8 oz glasses).  Avoid fast food or heavy meals for the first couple of weeks as your are more likely to get nauseated. Avoid raw/uncooked fruits or vegetables for the first 4 weeks (its ok to have these if they are blended into smoothie form). If you have fruits/vegetables, make sure they are cooked until soft enough to mash on the roof of your mouth and chew your food well. Otherwise, diet as tolerated.  Take your usually prescribed home medications unless otherwise directed.  PAIN CONTROL: Pain is best controlled by a usual combination of three different methods TOGETHER: Ice/Heat Over the counter pain medication Prescription pain medication Most patients will experience some swelling and bruising around the surgical site.  Ice packs or heating pads (30-60 minutes up to 6 times a day) will help. Some people prefer to use ice alone, heat alone, alternating between ice & heat.  Experiment to what works for you.  Swelling and bruising can take several weeks to resolve.   It is helpful to take an over-the-counter pain medication regularly for the first few weeks: Ibuprofen (Motrin/Advil) - 239m tabs - take 3 tabs (6031m every 6 hours as needed for pain (unless you have been directed previously to avoid NSAIDs/ibuprofen) Acetaminophen (Tylenol) - you may take 65062mvery 6 hours as needed. You can take this with motrin as they act differently on the body. If you are taking a narcotic pain medication that has acetaminophen in it, do not take over the counter tylenol at the same time. NOTE: You may take both of these medications together - most patients  find it most helpful when alternating between the two (i.e. Ibuprofen at 6am, tylenol at 9am, ibuprofen at 12pm ...) Marland Kitchen  prescription for pain medication should be given to you upon discharge.  Take your pain medication as  prescribed if your pain is not adequatly controlled with the over-the-counter pain reliefs mentioned above.  Avoid getting constipated.  Between the surgery and the pain medications, it is common to experience some constipation.  Increasing fluid intake and taking a fiber supplement (such as Metamucil, Citrucel, FiberCon, MiraLax, etc) 1-2 times a day regularly will usually help prevent this problem from occurring.  A mild laxative (prune juice, Milk of Magnesia, MiraLax, etc) should be taken according to package directions if there are no bowel movements after 48 hours.    Dressing: Your incisions are covered in Dermabond which is like sterile superglue for the skin. This will come off on it's own in a couple weeks. It is waterproof and you may bathe normally starting the day after your surgery in a shower. Avoid baths/pools/lakes/oceans until your wounds have fully healed.  ACTIVITIES as tolerated:   Avoid heavy lifting (>10lbs or 1 gallon of milk) for the next 6 weeks. You may resume regular daily activities as tolerated--such as daily self-care, walking, climbing stairs--gradually increasing activities as tolerated.  If you can walk 30 minutes without difficulty, it is safe to try more intense activity such as jogging, treadmill, bicycling, low-impact aerobics.  DO NOT PUSH THROUGH PAIN.  Let pain be your guide: If it hurts to do something, don't do it. You may drive when you are no longer taking prescription pain medication, you can comfortably wear a seatbelt, and you can safely maneuver your car and apply brakes.  FOLLOW UP in our  office Please call [facility] at [phone] to set up an appointment to see your surgeon in the office for a follow-up appointment approximately 2 weeks after your surgery. Make sure that you call for this appointment the day you arrive home to insure a convenient appointment time.  9. If you have disability or family leave forms that need to be completed, you may have  them completed by your primary care physician's office; for return to work instructions, please ask our office staff and they will be happy to assist you in obtaining this documentation   When to call us [phone]: Poor pain control Reactions / problems with new medications (rash/itching, etc)  Fever over 101.5 F (38.5 C) Inability to urinate Nausea/vomiting Worsening swelling or bruising Continued bleeding from incision. Increased pain, redness, or drainage from the incision  The clinic staff is available to answer your questions during regular business hours (8:30am-5pm).  Please don't hesitate to call and ask to speak to one of our nurses for clinical concerns.   A surgeon from [facility] is always on call at the hospitals   If you have a medical emergency, go to the nearest emergency room or call 911.  [facility], [place] [address], [place], [place], [place]  [address] MAIN: [phone] FAX: [phone] [email or web address]

## 2021-08-13 NOTE — Anesthesia Procedure Notes (Signed)
Procedure Name: Intubation Date/Time: 08/13/2021 7:53 AM Performed by: Montel Clock, CRNA Pre-anesthesia Checklist: Patient identified, Emergency Drugs available, Suction available, Patient being monitored and Timeout performed Patient Re-evaluated:Patient Re-evaluated prior to induction Oxygen Delivery Method: Circle system utilized Preoxygenation: Pre-oxygenation with 100% oxygen Induction Type: IV induction Ventilation: Mask ventilation without difficulty Laryngoscope Size: Mac and 3 Grade View: Grade II Tube type: Oral Tube size: 7.5 mm Number of attempts: 1 Airway Equipment and Method: Stylet Placement Confirmation: ETT inserted through vocal cords under direct vision, positive ETCO2 and breath sounds checked- equal and bilateral Secured at: 23 cm Tube secured with: Tape Dental Injury: Teeth and Oropharynx as per pre-operative assessment

## 2021-08-14 ENCOUNTER — Encounter (HOSPITAL_COMMUNITY): Payer: Self-pay | Admitting: Surgery

## 2021-08-14 ENCOUNTER — Other Ambulatory Visit (HOSPITAL_COMMUNITY): Payer: Self-pay

## 2021-08-14 LAB — CBC
HCT: 35.9 % — ABNORMAL LOW (ref 39.0–52.0)
Hemoglobin: 12.1 g/dL — ABNORMAL LOW (ref 13.0–17.0)
MCH: 29.4 pg (ref 26.0–34.0)
MCHC: 33.7 g/dL (ref 30.0–36.0)
MCV: 87.3 fL (ref 80.0–100.0)
Platelets: 197 10*3/uL (ref 150–400)
RBC: 4.11 MIL/uL — ABNORMAL LOW (ref 4.22–5.81)
RDW: 12.4 % (ref 11.5–15.5)
WBC: 7.5 10*3/uL (ref 4.0–10.5)
nRBC: 0 % (ref 0.0–0.2)

## 2021-08-14 LAB — BASIC METABOLIC PANEL
Anion gap: 6 (ref 5–15)
BUN: 9 mg/dL (ref 6–20)
CO2: 27 mmol/L (ref 22–32)
Calcium: 8.2 mg/dL — ABNORMAL LOW (ref 8.9–10.3)
Chloride: 104 mmol/L (ref 98–111)
Creatinine, Ser: 0.87 mg/dL (ref 0.61–1.24)
GFR, Estimated: >60 mL/min (ref >60)
Glucose, Bld: 94 mg/dL (ref 70–99)
Potassium: 3.5 mmol/L (ref 3.5–5.1)
Sodium: 137 mmol/L (ref 135–145)

## 2021-08-14 NOTE — Progress Notes (Signed)
S: Pain well controlled, reports some lower abdominal pressure.  Endorses flatus, but no bowel movement yet.  Foley out and voiding.  Tolerating liquids without nausea this morning.  O: Vitals, labs, intake/output, and orders reviewed at this time.  Afebrile, heart rate 66, normotensive, sats 96% on room air.  720 p.o., 3075 UOP.  BMP unremarkable, white count 7.5, hemoglobin 12.1 from 13.7 preop   Gen: A&Ox3, no distress  H&N: EOMI, atraumatic, neck supple Chest: unlabored respirations, RRR Abd: soft, minimally appropriately tender, nondistended, incision(s) c/d/i  Ext: warm, no edema Neuro: grossly normal  Lines/tubes/drains: piv  A/P: Postop day 1 status post LAR, progressing well -Advance diet -mobilize as tolerated   -Anticipate discharge tomorrow if continuing to progress well.   Romana Juniper, MD [facility], [place]

## 2021-08-15 LAB — BASIC METABOLIC PANEL
Anion gap: 8 (ref 5–15)
BUN: 7 mg/dL (ref 6–20)
CO2: 29 mmol/L (ref 22–32)
Calcium: 8.6 mg/dL — ABNORMAL LOW (ref 8.9–10.3)
Chloride: 102 mmol/L (ref 98–111)
Creatinine, Ser: 0.97 mg/dL (ref 0.61–1.24)
GFR, Estimated: >60 mL/min (ref >60)
Glucose, Bld: 89 mg/dL (ref 70–99)
Potassium: 3.9 mmol/L (ref 3.5–5.1)
Sodium: 139 mmol/L (ref 135–145)

## 2021-08-15 LAB — CBC
HCT: 37.8 % — ABNORMAL LOW (ref 39.0–52.0)
Hemoglobin: 12.7 g/dL — ABNORMAL LOW (ref 13.0–17.0)
MCH: 29.8 pg (ref 26.0–34.0)
MCHC: 33.6 g/dL (ref 30.0–36.0)
MCV: 88.7 fL (ref 80.0–100.0)
Platelets: 184 10*3/uL (ref 150–400)
RBC: 4.26 MIL/uL (ref 4.22–5.81)
RDW: 12.6 % (ref 11.5–15.5)
WBC: 4.3 10*3/uL (ref 4.0–10.5)
nRBC: 0 % (ref 0.0–0.2)

## 2021-08-15 LAB — MAGNESIUM: Magnesium: 1.7 mg/dL (ref 1.7–2.4)

## 2021-08-15 MED ORDER — MAGNESIUM SULFATE IN D5W 1-5 GM/100ML-% IV SOLN
1.0000 g | Freq: Once | INTRAVENOUS | Status: DC
  Filled 2021-08-15: qty 100

## 2021-08-15 MED ORDER — MAGNESIUM SULFATE 2 GM/50ML IV SOLN
2.0000 g | Freq: Once | INTRAVENOUS | Status: DC
  Filled 2021-08-15: qty 50

## 2021-08-15 MED ORDER — MAGNESIUM SULFATE 50 % IJ SOLN: 3.0000 g | Freq: Once | INTRAVENOUS | Status: DC

## 2021-08-15 NOTE — TOC CM/SW Note (Signed)
  Transition of Care [facility]) Screening Note   Patient Details  Name: Nathaniel Pratt Date of Birth: 11/15/1990   Transition of Care [facility]) CM/SW Contact:    Ross Ludwig, LCSW Phone Number: 08/15/2021, 4:25 PM    Transition of Care Department [facility]) has reviewed patient and no TOC needs have been identified at this time. We will continue to monitor patient advancement through interdisciplinary progression rounds. If new patient transition needs arise, please place a TOC consult.

## 2021-08-15 NOTE — Progress Notes (Signed)
Had to stop Magnesium infusion due to pt complaint of burning and pain on a scale of 6. MD notified and agreed to discontinue.

## 2021-08-15 NOTE — Progress Notes (Signed)
Pt alert and oriented, tolerating diet. D/C instructions given and pt d/cd home.

## 2021-08-15 NOTE — Progress Notes (Signed)
PHARMACIST-PROVIDER COMMUNICATION  Nathaniel Pratt is POD2 low anterior resection and was prescribed Entereg post-op to assist with bowel function.  Per RN, patient is having bowel movements this morning and hypoactive bowel sounds.  Entereg will be discontinued per protocol.  Thank you for allowing pharmacy to participate in this patient's care.  Please reach out with any questions.  Dimple Nanas, PharmD 08/15/2021 8:29 AM

## 2021-08-15 NOTE — Progress Notes (Signed)
S: Had a little bit of a rough day yesterday, with pressure sensation in his lower abdomen.  This is relieved with bowel movements which he continues to have.  He is tolerating a soft diet but does endorse a little bit of nausea today.  He is walking the halls.  O: Vitals, labs, intake/output, and orders reviewed at this time.  Afebrile, heart rate 70s, normotensive, sats 100% on room air.  1840 p.o., 1600 + 6x UOP.  BMP unremarkable, magnesium 1.7, white count 4.3, hemoglobin 12.7 from 13.7 preop   Gen: A&Ox3, no distress  H&N: EOMI, atraumatic, neck supple Chest: unlabored respirations, RRR Abd: soft, minimally appropriately tender, nondistended, incision(s) c/d/i  Ext: warm, no edema Neuro: grossly normal  Lines/tubes/drains: piv  A/P: Postop day 2 status post LAR, progressing well -Continue soft diet and ambulating as tolerated.   -Given nausea and lower abdominal pressure, may not quite be ready to discharge though based on his exam and progress otherwise, patient advised if he has a great day today and wants to go home this evening that would be acceptable.  Otherwise he may stay until tomorrow.Romana Juniper, MD [facility], [place]

## 2021-08-16 NOTE — Discharge Summary (Signed)
Patient ID: Nathaniel Pratt MRN: [id number] DOB/AGE: 04-Sep-1991 30 y.o.  Admit date: 08/13/2021 Discharge date: 08/15/2021  Discharge Diagnoses Patient Active Problem List   Diagnosis Date Noted   Rectal tumor 08/13/2021   S/P laparoscopic-assisted sigmoidectomy 08/13/2021   Genetic testing 06/24/2021   Family history of breast cancer 06/10/2021   Environmental and seasonal allergies 12/09/2020    Procedures OR 08/13/21 Robotic-assisted low anterior resection Diagnostic flexible sigmoidoscopy (to localize lesion) Intraoperative assessment of perfusion with ICG Bilateral transversus abdominus plane (TAP) blocks  Hospital Course: He was admitted postoperatively and did great. On POD#2 he was having spontaneous bowel function, tolerating diet, ambulating, pain well controlled. He was deemed stable for discharge home 08/15/21 by my partner.   Allergies as of 08/15/2021       Reactions   Amoxicillin Hives   Penicillins Hives, Itching   Reaction: 18 years        Medication List     TAKE these medications    albuterol 108 (90 Base) MCG/ACT inhaler Commonly known as: VENTOLIN HFA Inhale 1 Inhaler into the lungs every 6 (six) hours as needed for shortness of breath or wheezing.   cetirizine 10 MG tablet Commonly known as: ZYRTEC Take 10 mg by mouth daily.   magnesium oxide 400 MG tablet Commonly known as: MAG-OX Take 400 mg by mouth daily.   traMADol 50 MG tablet Commonly known as: Ultram Take 1 tablet (50 mg total) by mouth every 6 (six) hours as needed for up to 5 days (postop pain not controlled with tylenol/ibprofen first).          Follow-up Information     Ileana Roup, MD Follow up in 3 week(s).   Specialties: General Surgery, Colon and Rectal Surgery Why: ~2-4 weeks after surgery for postoperative appointment Contact information: [place] [id number] [phone]                 Sharon Mt.  Dema Severin, M.D. [facility]

## 2021-08-17 LAB — SURGICAL PATHOLOGY

## 2021-08-19 ENCOUNTER — Telehealth: Payer: BC Managed Care – PPO | Admitting: Gastroenterology

## 2021-09-02 ENCOUNTER — Telehealth (INDEPENDENT_AMBULATORY_CARE_PROVIDER_SITE_OTHER): Payer: BC Managed Care – PPO | Admitting: Gastroenterology

## 2021-09-02 ENCOUNTER — Encounter: Payer: Self-pay | Admitting: Gastroenterology

## 2021-09-02 DIAGNOSIS — K6289 Other specified diseases of anus and rectum: Secondary | ICD-10-CM

## 2021-09-02 DIAGNOSIS — D509 Iron deficiency anemia, unspecified: Secondary | ICD-10-CM

## 2021-09-02 NOTE — Progress Notes (Signed)
Nathaniel Pratt , MD [address]  [place]  [place], [place] [address]  Main: [phone]  Fax: [phone]   Primary Care Physician: Glean Hess, MD  Virtual Visit via Video Note  I connected with patient on 09/02/21 at  2:30 PM EST by video and verified that I am speaking with the correct person using two identifiers.   I discussed the limitations, risks, security and privacy concerns of performing an evaluation and management service by video  and the availability of in person appointments. I also discussed with the patient that there may be a patient responsible charge related to this service. The patient expressed understanding and agreed to proceed.  Location of Patient: Home Location of Provider: Home Persons involved: Patient and provider only   History of Present Illness:   C/c : follow up for iron deficiency anemia    HPI: Nathaniel Pratt is a 30 y.o. male  Initially referred and seen on 03/23/2021 for iron deficiency anemia.  Noticed blood in stool on and off for 7 years.  Has been on Excedrin's 2 tablets a day for many years.  Hemoglobin  12.9 g.  And ferritin of 6.Once he stopped using NSAIDs.  Had complete resolution of the rectal bleeding.   03/23/2021: H. pylori breath test negative, B12, folate, urine analysis, celiac serology was normal. 04/07/2021:EGD was normal.  A polypoid nonobstructing large mass was seen in the rectosigmoid colon it was partially circumferential and measured 2 cm in length and 13 mm in diameter.  No bleeding was noted.  Overlying mucosa was normal. 04/27/2021: CT abdomen and pelvis with contrast showed 2.4 cm rectal neoplasm is not characteristic for lipoma noted. He subsequently underwent an EUS of the rectal lesion and showed spindle cell proliferation on FNA.  He has been set up for robot assisted sigmoidectomy with Dr. Annye English.    Interval history 06/23/2021-09/02/2021  He underwent on 08/13/2021 rectosigmoid resection  and a benign schwannoma 2.4 cm in size was resected.  Margins of resection are normal.  18 pericolonic lymph nodes were resected without any abnormalities.  Since surgery he is doing well.  He will follow-up with Dr. Dema Severin.  He has occasional rectal bleeding.  But overall feels well.          Current Outpatient Medications  Medication Sig Dispense Refill   albuterol (VENTOLIN HFA) 108 (90 Base) MCG/ACT inhaler Inhale 1 Inhaler into the lungs every 6 (six) hours as needed for shortness of breath or wheezing.     cetirizine (ZYRTEC) 10 MG tablet Take 10 mg by mouth daily.     magnesium oxide (MAG-OX) 400 MG tablet Take 400 mg by mouth daily.     No current facility-administered medications for this visit.    Allergies as of 09/02/2021 - Review Complete 08/13/2021  Allergen Reaction Noted   Amoxicillin Hives 12/09/2020   Penicillins Hives and Itching 12/09/2020    Review of Systems:    All systems reviewed and negative except where noted in HPI.  General Appearance:    Alert, cooperative, no distress, appears stated age  Head:    Normocephalic, without obvious abnormality, atraumatic  Eyes:    PERRL, conjunctiva/corneas clear,  Ears:    Grossly normal hearing    Neurologic:  Grossly normal    Observations/Objective:  Labs: CMP     Component Value Date/Time   NA 139 08/15/2021 0447   NA 143 12/09/2020 1546   K 3.9 08/15/2021 0447  CL 102 08/15/2021 0447   CO2 29 08/15/2021 0447   GLUCOSE 89 08/15/2021 0447   BUN 7 08/15/2021 0447   BUN 23 (H) 12/09/2020 1546   CREATININE 0.97 08/15/2021 0447   CALCIUM 8.6 (L) 08/15/2021 0447   PROT 6.9 12/09/2020 1546   ALBUMIN 4.5 12/09/2020 1546   AST 21 12/09/2020 1546   ALT 15 12/09/2020 1546   ALKPHOS 64 12/09/2020 1546   BILITOT <0.2 12/09/2020 1546   GFRNONAA >60 08/15/2021 0447   Lab Results  Component Value Date   WBC 4.3 08/15/2021   HGB 12.7 (L) 08/15/2021   HCT 37.8 (L) 08/15/2021   MCV 88.7 08/15/2021   PLT 184  08/15/2021    Imaging Studies: No results found.  Assessment and Plan:   Nathaniel Pratt is a 30 y.o. y/o male initially referred for iron deficiency anemia and on endoscopy was found to have a submucosal rectal lesion which was about 3 cm in size.  Did not have the appearance of a normal polyp or colon cancer.  Referred for EUS which showed spindle cell tissue .  Status post rectosigmoid resection with removal of the schwannoma.  No evidence of malignancy.  Lymph nodes were negative.  Occasional rectal bleeding.  I believe this is postop related.  He has a follow-up with Dr. Dema Severin coming up next week which she will discuss with.  Explained to him that if it recurs may need a sigmoidoscopy.  Plan 1.  Follow-up with Dr. Annye English as scheduled.  Repeat CBC and iron studies next week.  Orders have been placed.  Follow-up as needed.        I discussed the assessment and treatment plan with the patient. The patient was provided an opportunity to ask questions and all were answered. The patient agreed with the plan and demonstrated an understanding of the instructions.   The patient was advised to call back or seek an in-person evaluation if the symptoms worsen or if the condition fails to improve as anticipated.  I provided 17 minutes of face-to-face time during this encounter.  Dr Nathaniel Bellows MD,MRCP [facility]) Gastroenterology/Hepatology Pager: [phone]   Speech recognition software was used to dictate this note.

## 2021-10-07 ENCOUNTER — Telehealth: Payer: Self-pay

## 2021-10-07 ENCOUNTER — Other Ambulatory Visit: Payer: Self-pay

## 2021-10-07 DIAGNOSIS — D509 Iron deficiency anemia, unspecified: Secondary | ICD-10-CM

## 2021-10-07 NOTE — Telephone Encounter (Signed)
Called patient and explained what was recommended by Dr. Dema Severin and Dr. Vicente Males. Patient agreed on scheduling a capsule study. Therefore, it will be on 10/12/2021.

## 2021-10-07 NOTE — Telephone Encounter (Signed)
-----  Message from Jonathon Bellows, MD sent at 10/06/2021  1:42 PM EST ----- Regarding: RE: Follow Up Nathaniel Pratt   Inform patient that Dr. Dema Severin communicated with me  regarding  his colon polyp and we discussed that felt it would probably be good to have his small bowel evaluated with a capsule study.  Explained to him what it means.  If he is willing to go through , please schedule otherwise we can have a video visit with him to discuss it and have it scheduled.  Thank you Dr Dema Severin - will proceed as mentioned    Nathaniel Pratt  ----- Message ----- From: Ileana Roup, MD Sent: 10/06/2021   1:42 PM EST To: Jonathon Bellows, MD Subject: Follow Up                                      Nathaniel Pratt -   This gentleman underwent resection of his submucosal rectosigmoid mass and did well. Pathology ended up showing schwannoma. He recovered well and about 2 weeks after surgery noticed blood admixed with his stool. He had previously undergone upper/lower for anemia. CT abd/pelvis didn't show any other evident findings aside from this mass. With schwannoma certainly wonder if he could also have lesions elsewhere in his GI tract? Would you be willing to see back and maybe we could get him a capsule?  Appreciate your help.  Gerald Stabs

## 2021-10-12 ENCOUNTER — Encounter
Admission: RE | Disposition: A | Discharge status: Home / Self Care | Payer: Self-pay | Source: Ambulatory Visit | Attending: Gastroenterology

## 2021-10-12 ENCOUNTER — Ambulatory Visit
Admission: RE | Admit: 2021-10-12 | Discharge: 2021-10-12 | Disposition: A | Discharge status: Home / Self Care | Payer: BC Managed Care – PPO | Source: Ambulatory Visit | Attending: Gastroenterology | Admitting: Gastroenterology

## 2021-10-12 DIAGNOSIS — D509 Iron deficiency anemia, unspecified: Secondary | ICD-10-CM | POA: Diagnosis not present

## 2021-10-12 DIAGNOSIS — D649 Anemia, unspecified: Secondary | ICD-10-CM | POA: Diagnosis not present

## 2021-10-12 HISTORY — PX: GIVENS CAPSULE STUDY: SHX5432

## 2021-10-12 SURGERY — IMAGING PROCEDURE, GI TRACT, INTRALUMINAL, VIA CAPSULE

## 2021-10-13 ENCOUNTER — Encounter: Payer: Self-pay | Admitting: Gastroenterology

## 2021-10-14 DIAGNOSIS — Z113 Encounter for screening for infections with a predominantly sexual mode of transmission: Secondary | ICD-10-CM | POA: Diagnosis not present

## 2021-10-27 ENCOUNTER — Telehealth: Payer: Self-pay

## 2021-10-27 DIAGNOSIS — D509 Iron deficiency anemia, unspecified: Secondary | ICD-10-CM

## 2021-10-27 NOTE — Telephone Encounter (Signed)
Patient is calling about the results of capsule study that he had done on 10/12/21

## 2021-10-27 NOTE — Telephone Encounter (Signed)
Called patient to let him know that his capsule study was normal. However, patient wanted to know what will be his next step since the capsule study was normal. Please advise.

## 2021-10-28 NOTE — Telephone Encounter (Signed)
Called patient and explained what Dr. Vicente Males recommended as what to do next. I told him that he could come to our office or go to [facility] and have his labs drawn. If it was to be stable, then we would have to recheck his blood tests again in 3-4 months. Then he is to have a colonoscopy in one year to make sure that he doesn't have any polyps. Patient agreed and stated that he would come to our office and have his labs drawn. Labs have been ordered.

## 2021-10-28 NOTE — Addendum Note (Signed)
Addended by: Wayna Chalet on: 10/28/2021 10:25 AM   Modules accepted: Orders

## 2021-10-29 ENCOUNTER — Ambulatory Visit
Admission: RE | Admit: 2021-10-29 | Discharge: 2021-10-29 | Disposition: A | Discharge status: Home / Self Care | Payer: BC Managed Care – PPO | Source: Ambulatory Visit | Attending: Student | Admitting: Student

## 2021-10-29 ENCOUNTER — Other Ambulatory Visit: Payer: Self-pay

## 2021-10-29 DIAGNOSIS — Z880 Allergy status to penicillin: Secondary | ICD-10-CM | POA: Insufficient documentation

## 2021-10-29 DIAGNOSIS — Z112 Encounter for screening for other bacterial diseases: Secondary | ICD-10-CM | POA: Insufficient documentation

## 2021-10-29 DIAGNOSIS — D509 Iron deficiency anemia, unspecified: Secondary | ICD-10-CM | POA: Diagnosis not present

## 2021-10-29 DIAGNOSIS — J020 Streptococcal pharyngitis: Secondary | ICD-10-CM | POA: Insufficient documentation

## 2021-10-29 LAB — POCT RAPID STREP A (OFFICE): Rapid Strep A Screen: NEGATIVE

## 2021-10-29 MED ORDER — AZITHROMYCIN 250 MG PO TABS: 250.0000 mg | ORAL_TABLET | Freq: Every day | ORAL | 0 refills | Status: DC

## 2021-10-29 NOTE — ED Provider Notes (Signed)
Nathaniel Pratt    CSN: [id number] Arrival date & time: 10/29/21  0931      History   Chief Complaint Chief Complaint  Patient presents with   Sore Throat   Otalgia    HPI Nathaniel Pratt is a 31 y.o. male presenting with sore throat and ear pain x3 days. History noncontributory. Describes sore throat with radiation of pain to ears. No hearing changes, dizziness, tinnitus. Subjective fevers and chills but temperature in normal range at home. Denies n/v/d/c, cough, chest pain, SOB.   HPI  Past Medical History:  Diagnosis Date   Allergy    Anemia    Asthma    Broken nose 2008   Family history of breast cancer    Thumb fracture 02/2009    Patient Active Problem List   Diagnosis Date Noted   Rectal tumor 08/13/2021   S/P laparoscopic-assisted sigmoidectomy 08/13/2021   Genetic testing 06/24/2021   Family history of breast cancer 06/10/2021   Environmental and seasonal allergies 12/09/2020    Past Surgical History:  Procedure Laterality Date   COLONOSCOPY WITH PROPOFOL N/A 04/07/2021   Procedure: COLONOSCOPY WITH PROPOFOL;  Surgeon: Jonathon Bellows, MD;  Location: [facility] ENDOSCOPY;  Service: Gastroenterology;  Laterality: N/A;   ESOPHAGOGASTRODUODENOSCOPY (EGD) WITH PROPOFOL N/A 04/07/2021   Procedure: ESOPHAGOGASTRODUODENOSCOPY (EGD) WITH PROPOFOL;  Surgeon: Jonathon Bellows, MD;  Location: [facility] ENDOSCOPY;  Service: Gastroenterology;  Laterality: N/A;   EUS N/A 04/29/2021   Procedure: LOWER ENDOSCOPIC ULTRASOUND (EUS);  Surgeon: Milus Banister, MD;  Location: [facility] ENDOSCOPY;  Service: Endoscopy;  Laterality: N/A;   FINE NEEDLE ASPIRATION N/A 04/29/2021   Procedure: FINE NEEDLE ASPIRATION (FNA) LINEAR;  Surgeon: Milus Banister, MD;  Location: [facility] ENDOSCOPY;  Service: Endoscopy;  Laterality: N/A;   FLEXIBLE SIGMOIDOSCOPY N/A 04/29/2021   Procedure: FLEXIBLE SIGMOIDOSCOPY;  Surgeon: Milus Banister, MD;  Location: [facility] ENDOSCOPY;  Service: Endoscopy;  Laterality: N/A;   FLEXIBLE  SIGMOIDOSCOPY N/A 08/13/2021   Procedure: DIAGNOSTIC FLEXIBLE SIGMOIDOSCOPY;  Surgeon: Ileana Roup, MD;  Location: [facility] ORS;  Service: General;  Laterality: N/A;   GIVENS CAPSULE STUDY N/A 10/12/2021   Procedure: GIVENS CAPSULE STUDY;  Surgeon: Jonathon Bellows, MD;  Location: [facility] ENDOSCOPY;  Service: Gastroenterology;  Laterality: N/A;   ORIF FINGER / THUMB FRACTURE     RHINOPLASTY  2008       Home Medications    Prior to Admission medications   Medication Sig Start Date End Date Taking? Authorizing Provider  azithromycin (ZITHROMAX Z-PAK) 250 MG tablet Take 1 tablet (250 mg total) by mouth daily. Two pills (545m) day 1. One pill per day (2534m days 2-5. 10/29/21  Yes GrHazel SamsPA-C  albuterol (VENTOLIN HFA) 108 (90 Base) MCG/ACT inhaler Inhale 1 Inhaler into the lungs every 6 (six) hours as needed for shortness of breath or wheezing. 11/23/18   [provider]  cetirizine (ZYRTEC) 10 MG tablet Take 10 mg by mouth daily. 12/04/20   [provider]  magnesium oxide (MAG-OX) 400 MG tablet Take 400 mg by mouth daily.    [provider]    Family History Family History  Problem Relation Age of Onset   Hypertension Mother    Allergies Mother    Hypertension Father    Bleeding Disorder Maternal Grandmother    Heart attack Maternal Grandfather    Breast cancer Paternal Grandmother    Clotting disorder Paternal Grandfather     Social History Social History   Tobacco Use  Smoking status: Never   Smokeless tobacco: Never  Vaping Use   Vaping Use: Never used  Substance Use Topics   Alcohol use: Not Currently   Drug use: Never     Allergies   Amoxicillin and Penicillins   Review of Systems Review of Systems  HENT:  Positive for sore throat.   All other systems reviewed and are negative.   Physical Exam Triage Vital Signs ED Triage Vitals  Enc Vitals Group     BP 10/29/21 0946 124/64     Pulse Rate 10/29/21 0946 (!) 106     Resp  10/29/21 0946 18     Temp 10/29/21 0946 99.4 F (37.4 C)     Temp src --      SpO2 10/29/21 0946 97 %     Weight --      Height --      Head Circumference --      Peak Flow --      Pain Score 10/29/21 0948 7     Pain Loc --      Pain Edu? --      Excl. in [place]? --    No data found.  Updated Vital Signs BP 124/64    Pulse (!) 106    Temp 99.4 F (37.4 C)    Resp 18    SpO2 97%   Visual Acuity Right Eye Distance:   Left Eye Distance:   Bilateral Distance:    Right Eye Near:   Left Eye Near:    Bilateral Near:     Physical Exam Vitals reviewed.  Constitutional:      Appearance: Normal appearance. He is not ill-appearing.  HENT:     Head: Normocephalic and atraumatic.     Right Ear: Hearing, tympanic membrane, ear canal and external ear normal. No swelling or tenderness. No middle ear effusion. There is no impacted cerumen. No mastoid tenderness. Tympanic membrane is not injected, scarred, perforated, erythematous, retracted or bulging.     Left Ear: Hearing, tympanic membrane, ear canal and external ear normal. No swelling or tenderness.  No middle ear effusion. There is no impacted cerumen. No mastoid tenderness. Tympanic membrane is not injected, scarred, perforated, erythematous, retracted or bulging.     Mouth/Throat:     Pharynx: Oropharynx is clear. Posterior oropharyngeal erythema present. No oropharyngeal exudate.     Tonsils: No tonsillar exudate. 1+ on the right. 1+ on the left.     Comments: Beefy erythema posterior pharynx with few petechiae. No exudate. On exam, uvula is midline, he is tolerating secretions without difficulty, there is no trismus, no drooling, he has normal phonation  Cardiovascular:     Rate and Rhythm: Normal rate and regular rhythm.     Heart sounds: Normal heart sounds.  Pulmonary:     Effort: Pulmonary effort is normal.     Breath sounds: Normal breath sounds.  Lymphadenopathy:     Cervical: No cervical adenopathy.  Neurological:      General: No focal deficit present.     Mental Status: He is alert and oriented to person, place, and time.  Psychiatric:        Mood and Affect: Mood normal.        Behavior: Behavior normal.        Thought Content: Thought content normal.        Judgment: Judgment normal.     UC Treatments / Results  Labs (all labs ordered are listed, but only abnormal results  are displayed) Labs Reviewed  CULTURE, GROUP A STREP [facility])  POCT RAPID STREP A (OFFICE)    EKG   Radiology No results found.  Procedures Procedures (including critical care time)  Medications Ordered in UC Medications - No data to display  Initial Impression / Assessment and Plan / UC Course  I have reviewed the triage vital signs and the nursing notes.  Pertinent labs & imaging results that were available during my care of the patient were reviewed by me and considered in my medical decision making (see chart for details).     This patient is a very pleasant 31 y.o. year old male presenting with suspected strep pharyngitis. Afebrile but borderline tachy.  Rapid strep negative, culture sent.  Centor score 2, clinically patient appears to have strep throat.  Penicillin allergic. Azithromycin sent as below.   ED return precautions discussed. Patient verbalizes understanding and agreement.    Final Clinical Impressions(s) / UC Diagnoses   Final diagnoses:  Strep pharyngitis  Screening for streptococcal infection  Penicillin allergy     Discharge Instructions      -Your strep test was negative, but I think this was a false negative. We'll send a culture, and treat you today for strep. -Z-pack for 5 days -You can continue tylenol/ibuprofen for discomfort, and make sure to drink plenty of fluids -You'll still be contagious for 24 hours after starting the antibiotic. This means you can go back to work in 1 day.  -Make sure to throw out your toothbrush after 24 hours so you don't give the strep back to  yourself.  -Seek additional medical attention if symptoms are getting worse instead of better- trouble swallowing, shortness of breath, voice changes, etc.    ED Prescriptions     Medication Sig Dispense Auth. Provider   azithromycin (ZITHROMAX Z-PAK) 250 MG tablet Take 1 tablet (250 mg total) by mouth daily. Two pills (548m) day 1. One pill per day (2569m days 2-5. 6 tablet GrHazel SamsPA-C      PDMP not reviewed this encounter.   GrHazel SamsPA-C 10/29/21 1028

## 2021-10-29 NOTE — Discharge Instructions (Addendum)
-  Your strep test was negative, but I think this was a false negative. We'll send a culture, and treat you today for strep. -Z-pack for 5 days -You can continue tylenol/ibuprofen for discomfort, and make sure to drink plenty of fluids -You'll still be contagious for 24 hours after starting the antibiotic. This means you can go back to work in 1 day.  -Make sure to throw out your toothbrush after 24 hours so you don't give the strep back to yourself.  -Seek additional medical attention if symptoms are getting worse instead of better- trouble swallowing, shortness of breath, voice changes, etc.

## 2021-10-29 NOTE — ED Triage Notes (Signed)
Pt presents with ST and bilateral clogged ear\s x 3 days

## 2021-10-30 LAB — IRON,TIBC AND FERRITIN PANEL
Ferritin: 80 ng/mL (ref 30–400)
Iron Saturation: 4 % — CL (ref 15–55)
Iron: 12 ug/dL — ABNORMAL LOW (ref 38–169)
Total Iron Binding Capacity: 320 ug/dL (ref 250–450)
UIBC: 308 ug/dL (ref 111–343)

## 2021-10-30 LAB — CBC
Hematocrit: 38 % (ref 37.5–51.0)
Hemoglobin: 13 g/dL (ref 13.0–17.7)
MCH: 28.3 pg (ref 26.6–33.0)
MCHC: 34.2 g/dL (ref 31.5–35.7)
MCV: 83 fL (ref 79–97)
Platelets: 202 10*3/uL (ref 150–450)
RBC: 4.59 x10E6/uL (ref 4.14–5.80)
RDW: 12.9 % (ref 11.6–15.4)
WBC: 11.4 10*3/uL — ABNORMAL HIGH (ref 3.4–10.8)

## 2021-10-31 LAB — CULTURE, GROUP A STREP (THRC)

## 2021-11-04 NOTE — Progress Notes (Signed)
Maritza inform patient CBC and iron studies are looking good. Can recheck in 3 months.   C/c Glean Hess, MD (FYI)  Dr Jonathon Bellows MD,MRCP [facility]) Gastroenterology/Hepatology Pager: [phone]

## 2021-11-11 ENCOUNTER — Telehealth: Payer: Self-pay

## 2021-11-11 DIAGNOSIS — D509 Iron deficiency anemia, unspecified: Secondary | ICD-10-CM

## 2021-11-11 NOTE — Telephone Encounter (Signed)
Patient was informed about his lab results and Dr. Georgeann Oppenheim recommendations for a lab repeat in 3 months to make sure that his iron levels are getting better. Patient stated that he is currently taking iron supplements and that he is happy to know that they are helping him. Patient stated that he will remind himself to repeat his labs in 3 months. I told him that we would call him with results. Patient understood and had no further questions. ?

## 2021-11-11 NOTE — Telephone Encounter (Signed)
-----  Message from Jonathon Bellows, MD sent at 11/04/2021  9:44 AM EST ----- ?D[place]ik [place] inform patient CBC and iron studies are looking good. Can recheck in 3 months.  ? ?C/c Glean Hess, MD [facility]) ? ?Dr Jonathon Bellows MD,MRCP [facility]) ?Gastroenterology/Hepatology ?Pager: [phone] ? ?

## 2021-12-07 DIAGNOSIS — K921 Melena: Secondary | ICD-10-CM | POA: Diagnosis not present

## 2021-12-07 DIAGNOSIS — D3610 Benign neoplasm of peripheral nerves and autonomic nervous system, unspecified: Secondary | ICD-10-CM | POA: Diagnosis not present

## 2022-02-14 ENCOUNTER — Telehealth: Payer: Self-pay | Admitting: Gastroenterology

## 2022-02-14 DIAGNOSIS — D509 Iron deficiency anemia, unspecified: Secondary | ICD-10-CM | POA: Diagnosis not present

## 2022-02-14 NOTE — Telephone Encounter (Signed)
Patient states his insurance companies fax is [phone]

## 2022-02-15 DIAGNOSIS — D509 Iron deficiency anemia, unspecified: Secondary | ICD-10-CM

## 2022-02-15 LAB — CBC
Hematocrit: 40.3 % (ref 37.5–51.0)
Hemoglobin: 13.6 g/dL (ref 13.0–17.7)
MCH: 28.6 pg (ref 26.6–33.0)
MCHC: 33.7 g/dL (ref 31.5–35.7)
MCV: 85 fL (ref 79–97)
Platelets: 211 10*3/uL (ref 150–450)
RBC: 4.76 x10E6/uL (ref 4.14–5.80)
RDW: 12.2 % (ref 11.6–15.4)
WBC: 5.1 10*3/uL (ref 3.4–10.8)

## 2022-02-15 NOTE — Telephone Encounter (Signed)
Called Nathaniel Pratt but had to leave him a detailed message letting him know that I received his message in reference to his insurance's fax number and that I went ahead and faxed it and received confirmation.

## 2022-02-15 NOTE — Telephone Encounter (Signed)
That's correct - we can proceed in two ways - we can either check iron ferritin now and then repeat in 3 months if normal  or just check in 3 months. I did intend to check iron levels this time but seems to  have been missed . Let me know your thoughts   Dr Vicente Males

## 2022-02-15 NOTE — Progress Notes (Signed)
Maritza inform   Hb is back to normal - would suggest we can stop his iron tablets and recheck in 3-4 months cbc and iron studies to ensure not dropped .  C.c Glean Hess, MD (FYI)   Dr Jonathon Bellows MD,MRCP [facility]) Gastroenterology/Hepatology Pager: [phone]

## 2022-02-17 DIAGNOSIS — D509 Iron deficiency anemia, unspecified: Secondary | ICD-10-CM | POA: Diagnosis not present

## 2022-02-18 LAB — CBC WITH DIFFERENTIAL/PLATELET
Basophils Absolute: 0.1 10*3/uL (ref 0.0–0.2)
Basos: 1 %
EOS (ABSOLUTE): 0.2 10*3/uL (ref 0.0–0.4)
Eos: 4 %
Hematocrit: 38.2 % (ref 37.5–51.0)
Hemoglobin: 12.7 g/dL — ABNORMAL LOW (ref 13.0–17.7)
Immature Grans (Abs): 0 10*3/uL (ref 0.0–0.1)
Immature Granulocytes: 0 %
Lymphocytes Absolute: 1.9 10*3/uL (ref 0.7–3.1)
Lymphs: 36 %
MCH: 28.1 pg (ref 26.6–33.0)
MCHC: 33.2 g/dL (ref 31.5–35.7)
MCV: 85 fL (ref 79–97)
Monocytes Absolute: 0.4 10*3/uL (ref 0.1–0.9)
Monocytes: 8 %
Neutrophils Absolute: 2.6 10*3/uL (ref 1.4–7.0)
Neutrophils: 51 %
Platelets: 198 10*3/uL (ref 150–450)
RBC: 4.52 x10E6/uL (ref 4.14–5.80)
RDW: 12.6 % (ref 11.6–15.4)
WBC: 5.1 10*3/uL (ref 3.4–10.8)

## 2022-02-18 LAB — IRON,TIBC AND FERRITIN PANEL
Ferritin: 16 ng/mL — ABNORMAL LOW (ref 30–400)
Iron Saturation: 9 % — CL (ref 15–55)
Iron: 30 ug/dL — ABNORMAL LOW (ref 38–169)
Total Iron Binding Capacity: 344 ug/dL (ref 250–450)
UIBC: 314 ug/dL (ref 111–343)

## 2022-02-18 NOTE — Progress Notes (Signed)
Inform  1. Hb oddly lower than what it was 4 days back and iron still low   2. Options continue oral iron and recheck in a few months or get IV iron .   3. Another question is that should we go back and check his colon - happy to discuss next week- can do office visit or video visit

## 2022-02-22 ENCOUNTER — Telehealth: Payer: Self-pay

## 2022-02-22 DIAGNOSIS — D509 Iron deficiency anemia, unspecified: Secondary | ICD-10-CM

## 2022-02-22 NOTE — Telephone Encounter (Signed)
Ok sounds good

## 2022-02-22 NOTE — Telephone Encounter (Signed)
-----  Message from Jonathon Bellows, MD sent at 02/18/2022  8:29 AM EDT ----- Inform  1. Hb oddly lower than what it was 4 days back and iron still low   2. Options continue oral iron and recheck in a few months or get IV iron .   3. Another question is that should we go back and check his colon - happy to discuss next week- can do office visit or video visit

## 2022-02-22 NOTE — Telephone Encounter (Signed)
Called patient to let him know what Dr. Vicente Males stated about his results. Patient stated that at this time he wants to continue taking oral supplements. Patient also stated that he wants to wait until next year to repeat his colonoscopy. Patient stated that he will come back in 3 months to recheck his labs and hoping that his iron and hemoglobin levels are normal.

## 2022-03-10 ENCOUNTER — Encounter: Payer: Self-pay | Admitting: Emergency Medicine

## 2022-03-10 DIAGNOSIS — Z20822 Contact with and (suspected) exposure to covid-19: Secondary | ICD-10-CM | POA: Diagnosis not present

## 2022-03-10 DIAGNOSIS — R112 Nausea with vomiting, unspecified: Secondary | ICD-10-CM | POA: Insufficient documentation

## 2022-03-10 DIAGNOSIS — E700 Classical phenylketonuria: Secondary | ICD-10-CM | POA: Insufficient documentation

## 2022-03-10 DIAGNOSIS — R519 Headache, unspecified: Secondary | ICD-10-CM | POA: Diagnosis not present

## 2022-03-10 DIAGNOSIS — R21 Rash and other nonspecific skin eruption: Secondary | ICD-10-CM | POA: Insufficient documentation

## 2022-03-10 LAB — CBC WITH DIFFERENTIAL/PLATELET
Abs Immature Granulocytes: 0.01 10*3/uL (ref 0.00–0.07)
Basophils Absolute: 0 10*3/uL (ref 0.0–0.1)
Basophils Relative: 0 %
Eosinophils Absolute: 0.1 10*3/uL (ref 0.0–0.5)
Eosinophils Relative: 2 %
HCT: 39.6 % (ref 39.0–52.0)
Hemoglobin: 13.1 g/dL (ref 13.0–17.0)
Immature Granulocytes: 0 %
Lymphocytes Relative: 21 %
Lymphs Abs: 1 10*3/uL (ref 0.7–4.0)
MCH: 27.9 pg (ref 26.0–34.0)
MCHC: 33.1 g/dL (ref 30.0–36.0)
MCV: 84.3 fL (ref 80.0–100.0)
Monocytes Absolute: 0.5 10*3/uL (ref 0.1–1.0)
Monocytes Relative: 11 %
Neutro Abs: 3.2 10*3/uL (ref 1.7–7.7)
Neutrophils Relative %: 66 %
Platelets: 179 10*3/uL (ref 150–400)
RBC: 4.7 MIL/uL (ref 4.22–5.81)
RDW: 12.8 % (ref 11.5–15.5)
WBC: 4.9 10*3/uL (ref 4.0–10.5)
nRBC: 0 % (ref 0.0–0.2)

## 2022-03-10 LAB — COMPREHENSIVE METABOLIC PANEL
ALT: 20 U/L (ref 0–44)
AST: 27 U/L (ref 15–41)
Albumin: 4.3 g/dL (ref 3.5–5.0)
Alkaline Phosphatase: 52 U/L (ref 38–126)
Anion gap: 8 (ref 5–15)
BUN: 25 mg/dL — ABNORMAL HIGH (ref 6–20)
CO2: 28 mmol/L (ref 22–32)
Calcium: 8.9 mg/dL (ref 8.9–10.3)
Chloride: 97 mmol/L — ABNORMAL LOW (ref 98–111)
Creatinine, Ser: 1.06 mg/dL (ref 0.61–1.24)
GFR, Estimated: >60 mL/min (ref >60)
Glucose, Bld: 99 mg/dL (ref 70–99)
Potassium: 3.8 mmol/L (ref 3.5–5.1)
Sodium: 133 mmol/L — ABNORMAL LOW (ref 135–145)
Total Bilirubin: 0.8 mg/dL (ref 0.3–1.2)
Total Protein: 7.3 g/dL (ref 6.5–8.1)

## 2022-03-10 LAB — URINALYSIS, ROUTINE W REFLEX MICROSCOPIC
Bilirubin Urine: NEGATIVE
Glucose, UA: NEGATIVE mg/dL
Hgb urine dipstick: NEGATIVE
Ketones, ur: 80 mg/dL — AB
Leukocytes,Ua: NEGATIVE
Nitrite: NEGATIVE
Protein, ur: NEGATIVE mg/dL
Specific Gravity, Urine: 1.023 (ref 1.005–1.030)
pH: 7 (ref 5.0–8.0)

## 2022-03-10 LAB — SARS CORONAVIRUS 2 BY RT PCR: SARS Coronavirus 2 by RT PCR: NEGATIVE

## 2022-03-10 NOTE — ED Provider Triage Note (Signed)
Emergency Medicine Provider Triage Evaluation Note  Nathaniel Pratt, a 31 y.o. male  was evaluated in triage.  Pt complains of generalized headache, with nausea, vomiting, and a generalized rash to his torso.  He noted the rash about 2 days ago.  Denies any pruritic lesions, denies any vesicles, whelps, or excoriations.  He denies any sick contacts, recent travel, or known allergens, triggers or exposures.  He denies any sore throat, cough, or congestion.  He does report he has traveled extensively in the last month.  Patient took Tylenol prior to arrival reports that it did improve his symptoms.  Review of Systems  Positive: Headache, nausea, vomiting, and rash Negative: FCS, CP  Physical Exam  BP 118/81   Pulse 86   Temp 98.2 F (36.8 C) (Oral)   Resp 20   Ht 6' (1.829 m)   Wt 86.2 kg   SpO2 98%   BMI 25.77 kg/m  Gen:   Awake, no distress  NAD Resp:  Normal effort CTA MSK:   Moves extremities without difficulty  Other:  Multiple erythematous, 2 mm macular lesions noted to the torso and trunk.  No excoriations, vesicles, pustules, or whelps noted  Medical Decision Making  Medically screening exam initiated at 10:42 PM.  Appropriate orders placed.  Cindra Eves was informed that the remainder of the evaluation will be completed by another provider, this initial triage assessment does not replace that evaluation, and the importance of remaining in the ED until their evaluation is complete.  Patient to the ED for evaluation of intermittent headache complaint with associated nausea vomiting, and a generalized, nonpruritic rash for the last 2 days.   Melvenia Needles, PA-C 03/10/22 2245

## 2022-03-10 NOTE — ED Triage Notes (Signed)
Pt presents via POV with complaints of a headache, N/V, and a rash on his torso that started 2 days ago. Pt states that he has traveled a lot in the last month. PTA pt took Tylenol and it helped with his sx. Denies CP or SOB.

## 2022-03-11 ENCOUNTER — Emergency Department: Payer: BC Managed Care – PPO

## 2022-03-11 ENCOUNTER — Emergency Department
Admission: EM | Admit: 2022-03-11 | Discharge: 2022-03-11 | Disposition: A | Discharge status: Home / Self Care | Payer: BC Managed Care – PPO | Attending: Emergency Medicine | Admitting: Emergency Medicine

## 2022-03-11 DIAGNOSIS — E860 Dehydration: Secondary | ICD-10-CM

## 2022-03-11 DIAGNOSIS — R519 Headache, unspecified: Secondary | ICD-10-CM

## 2022-03-11 DIAGNOSIS — R824 Acetonuria: Secondary | ICD-10-CM

## 2022-03-11 MED ORDER — LACTATED RINGERS IV BOLUS
1000.0000 mL | Freq: Once | INTRAVENOUS | Status: AC
  Administered 2022-03-11: 1000 mL via INTRAVENOUS

## 2022-03-11 MED ORDER — PROCHLORPERAZINE EDISYLATE 10 MG/2ML IJ SOLN
10.0000 mg | Freq: Once | INTRAMUSCULAR | Status: AC
  Administered 2022-03-11: 10 mg via INTRAVENOUS
  Filled 2022-03-11: qty 2

## 2022-03-11 MED ORDER — KETOROLAC TROMETHAMINE 30 MG/ML IJ SOLN
15.0000 mg | Freq: Once | INTRAMUSCULAR | Status: AC
Start: 2022-03-11 — End: 2022-03-11
  Administered 2022-03-11: 15 mg via INTRAVENOUS
  Filled 2022-03-11: qty 1

## 2022-03-11 MED ORDER — BUTALBITAL-APAP-CAFFEINE 50-325-40 MG PO TABS
2.0000 | ORAL_TABLET | Freq: Once | ORAL | Status: AC
  Administered 2022-03-11: 2 via ORAL
  Filled 2022-03-11: qty 2

## 2022-03-11 NOTE — ED Notes (Signed)
Pt ambulated around hallway. Pt states no c/o headache while ambulating.

## 2022-03-11 NOTE — ED Notes (Signed)
Pt verbalized understanding of discharge instructions and follow-up care instructions. Pt advised if symptoms worsen to return to ED.

## 2022-03-11 NOTE — Discharge Instructions (Signed)
Please take Tylenol and ibuprofen/Advil for your pain.  It is safe to take them together, or to alternate them every few hours.  Take up to 1037m of Tylenol at a time, up to 4 times per day.  Do not take more than 4000 mg of Tylenol in 24 hours.  For ibuprofen, take 400-600 mg, 3 - 4 times per day.

## 2022-03-11 NOTE — ED Provider Notes (Signed)
[facility] Provider Note    Event Date/Time   First MD Initiated Contact with Patient 03/11/22 0104     (approximate)   History   Headache   HPI  Nathaniel Pratt is a 31 y.o. male who presents to the ED for evaluation of Headache   I reviewed surgical evaluation from 4/4.  History of anemia, headaches and 2 cm rectal mass s/p resection with pathology results with a schwannoma without features of malignancy.  Patient presents to the ED, accompanied by his wife, for evaluation of a bad headache.  Reports just 1 day of headache that is bifrontal with associated nausea and vomiting of 1 episode.  He reports having history of headaches as a child that would often improve after emesis, but his headache did not improve after emesis today, so he presents to the ED for evaluation. No vision changes, falls or syncope, fever, neck stiffness.   Patient does also reports a diffuse rash to his bilateral upper extremities and trunk for the past 3 to 4 days.  Reports intermittent travel over the past few weeks, but all domestic, to the [place] and [place].  Rash is not painful, does not pruritic and does not bother him.  He questions if it is a heat rash.  Physical Exam   Triage Vital Signs: ED Triage Vitals  Enc Vitals Group     BP 03/10/22 2123 118/81     Pulse Rate 03/10/22 2123 86     Resp 03/10/22 2123 20     Temp 03/10/22 2123 98.2 F (36.8 C)     Temp Source 03/10/22 2123 Oral     SpO2 03/10/22 2123 98 %     Weight 03/10/22 2120 190 lb (86.2 kg)     Height 03/10/22 2120 6' (1.829 m)     Head Circumference --      Peak Flow --      Pain Score 03/10/22 2119 6     Pain Loc --      Pain Edu? --      Excl. in [place]? --     Most recent vital signs: Vitals:   03/10/22 2123  BP: 118/81  Pulse: 86  Resp: 20  Temp: 98.2 F (36.8 C)  SpO2: 98%    General: Awake, no distress.  CV:  Good peripheral perfusion.  Resp:  Normal effort.  Abd:  No  distention.  Soft and benign throughout MSK:  No deformity noted.  Neuro:  No focal deficits appreciated. Cranial nerves II through XII intact 5/5 strength and sensation in all 4 extremities Other:  Nonspecific flat macular erythematous rash as pictured below.  Not reminiscent of petechiae.  Does not coalesced any larger erythema.  Not raised.  No induration, tenderness or scaling.      ED Results / Procedures / Treatments   Labs (all labs ordered are listed, but only abnormal results are displayed) Labs Reviewed  COMPREHENSIVE METABOLIC PANEL - Abnormal; Notable for the following components:      Result Value   Sodium 133 (*)    Chloride 97 (*)    BUN 25 (*)    All other components within normal limits  URINALYSIS, ROUTINE W REFLEX MICROSCOPIC - Abnormal; Notable for the following components:   Color, Urine YELLOW (*)    APPearance CLEAR (*)    Ketones, ur 80 (*)    All other components within normal limits  SARS CORONAVIRUS 2 BY RT PCR  CBC WITH  DIFFERENTIAL/PLATELET    EKG   RADIOLOGY CT head interpreted by me without evidence of acute intracranial pathology  Official radiology report(s): No results found.  PROCEDURES and INTERVENTIONS:  Procedures  Medications - No data to display   IMPRESSION / MDM / [place] / ED COURSE  I reviewed the triage vital signs and the nursing notes.  Differential diagnosis includes, but is not limited to, migraine, meningitis, intracranial mass, cva  {Patient presents with symptoms of an acute illness or injury that is potentially life-threatening.  31yo male with hx of headaches presents to the ED with a more severe headache, with a reassuring workup and suitable for outpatient management. He has a generally reassuring exam without evidence of neurologic deficits. He is noted to have a diffuse rash for a few days longer than his presenting headache, and this rash is asymptomatic.  Macular rash without clear etiology  and is very nonspecific.  I doubt it is even related to his headache.  His work-up is quite reassuring and benign.  CBC is normal, metabolic panel without significant derangements.  Negative COVID test.  His UA does demonstrate ketonuria, suggestive of dehydration in the setting of his emesis this evening.  His symptoms resolved after Toradol, fluid resuscitation and Compazine.  His CT head is reassuring without evidence of intracranial pathology.  He is suitable for outpatient management and we discussed close return precautions.  Clinical Course as of 03/11/22 0341  Fri Mar 11, 2022  0339 Reassessed. Feeling better. Rash resolving and barely noticeable now. We discussed reassuring workup. Discussed management at home and return precautions [DS]    Clinical Course User Index [DS] Vladimir Crofts, MD     FINAL CLINICAL IMPRESSION(S) / ED DIAGNOSES   Final diagnoses:  None     Rx / DC Orders   ED Discharge Orders     None        Note:  This document was prepared using Dragon voice recognition software and may include unintentional dictation errors.   Vladimir Crofts, MD 03/11/22 0400

## 2022-03-11 NOTE — ED Notes (Addendum)
Pt presents to ED with c/o headache. Pt states his headache started around 2:30 pm and then got worse around 6 pm. Pt states he threw up around 8 pm. Pt also c/o rash that appeared around Tuesday. Pt denies fevers.   Pt still c/o Nausea. Rash noted on torso and back. Pt NAD at this time.

## 2022-03-21 DIAGNOSIS — D509 Iron deficiency anemia, unspecified: Secondary | ICD-10-CM | POA: Diagnosis not present

## 2022-03-21 DIAGNOSIS — D3610 Benign neoplasm of peripheral nerves and autonomic nervous system, unspecified: Secondary | ICD-10-CM | POA: Diagnosis not present

## 2022-04-13 ENCOUNTER — Inpatient Hospital Stay: Payer: BC Managed Care – PPO | Attending: Oncology | Admitting: Oncology

## 2022-04-13 ENCOUNTER — Inpatient Hospital Stay: Payer: BC Managed Care – PPO

## 2022-04-13 ENCOUNTER — Encounter: Payer: Self-pay | Admitting: Oncology

## 2022-04-13 DIAGNOSIS — Z52000 Unspecified donor, whole blood: Secondary | ICD-10-CM | POA: Diagnosis not present

## 2022-04-13 DIAGNOSIS — E611 Iron deficiency: Secondary | ICD-10-CM | POA: Insufficient documentation

## 2022-04-14 DIAGNOSIS — E611 Iron deficiency: Secondary | ICD-10-CM | POA: Insufficient documentation

## 2022-04-14 NOTE — Progress Notes (Signed)
Hematology/Oncology Consult note [phone] [phone]      Patient Care Team: Nathaniel Hess, MD as PCP - General (Internal Medicine)   REFERRING PROVIDER: Ileana Roup, MD  CHIEF COMPLAINTS/REASON FOR VISIT:  Anemia  ASSESSMENT & PLAN:  Iron deficiency Previous Labs are reviewed and discussed with patient. Patient has normal hemoglobin, iron panel is consistent with iron deficiency Likely secondary to blood loss secondary to blood donation Recommend patient to hold off blood donation Take oral iron supplementation Patient deferred to repeat blood work today He will follow-up with primary care provider in a few months and repeat CBC at that point I recommend patient to call back and schedule follow-up appointment if hemoglobin remains decreased despite holding of blood donation and taking oral iron supplementation.  No orders of the defined types were placed in this encounter.  Follow-up as needed All questions were answered. The patient knows to call the clinic with any problems, questions or concerns.  Nathaniel Server, MD, PhD [facility] Hematology Oncology 04/13/2022     HISTORY OF PRESENTING ILLNESS:  Nathaniel Pratt is a  31 y.o.  male with PMH listed below who was referred to me for anemia Reviewed patient's recent labs that was done.  CBC on 03/10/2022, patient has a hemoglobin of 13.1, MCV 84.3, iron saturation 9, ferritin 61. Reviewed patient's previous labs ordered by primary care physician's office,  Patient has a history of iron deficiency since 2022.  Patient has taken oral iron supplementation on and off.  He denies recent chest pain on exertion, shortness of breath on minimal exertion, pre-syncopal episodes, or palpitations He had not noticed any recent bleeding such as epistaxis, hematuria or hematochezia.  He denies over the counter NSAID ingestion.  His last colonoscopy was 8/3/ 2022, there was mass in the rectosigmoid  colon.  08/13/2021, status post rectosigmoid resection, pathology showed submucosal benign schwannoma.  Final distal margin without microscopic abnormalities. He denies any pica and eats a variety of diet. Patient donates blood every 3 months.   MEDICAL HISTORY:  Past Medical History:  Diagnosis Date   Allergy    Anemia    Asthma    Broken nose 2008   Family history of breast cancer    Thumb fracture 02/2009    SURGICAL HISTORY: Past Surgical History:  Procedure Laterality Date   COLONOSCOPY WITH PROPOFOL N/A 04/07/2021   Procedure: COLONOSCOPY WITH PROPOFOL;  Surgeon: Nathaniel Bellows, MD;  Location: [facility] ENDOSCOPY;  Service: Gastroenterology;  Laterality: N/A;   ESOPHAGOGASTRODUODENOSCOPY (EGD) WITH PROPOFOL N/A 04/07/2021   Procedure: ESOPHAGOGASTRODUODENOSCOPY (EGD) WITH PROPOFOL;  Surgeon: Nathaniel Bellows, MD;  Location: [facility] ENDOSCOPY;  Service: Gastroenterology;  Laterality: N/A;   EUS N/A 04/29/2021   Procedure: LOWER ENDOSCOPIC ULTRASOUND (EUS);  Surgeon: Nathaniel Banister, MD;  Location: [facility] ENDOSCOPY;  Service: Endoscopy;  Laterality: N/A;   FINE NEEDLE ASPIRATION N/A 04/29/2021   Procedure: FINE NEEDLE ASPIRATION (FNA) LINEAR;  Surgeon: Nathaniel Banister, MD;  Location: [facility] ENDOSCOPY;  Service: Endoscopy;  Laterality: N/A;   FLEXIBLE SIGMOIDOSCOPY N/A 04/29/2021   Procedure: FLEXIBLE SIGMOIDOSCOPY;  Surgeon: Nathaniel Banister, MD;  Location: [facility] ENDOSCOPY;  Service: Endoscopy;  Laterality: N/A;   FLEXIBLE SIGMOIDOSCOPY N/A 08/13/2021   Procedure: DIAGNOSTIC FLEXIBLE SIGMOIDOSCOPY;  Surgeon: Nathaniel Roup, MD;  Location: [facility] ORS;  Service: General;  Laterality: N/A;   GIVENS CAPSULE STUDY N/A 10/12/2021   Procedure: GIVENS CAPSULE STUDY;  Surgeon: Nathaniel Bellows, MD;  Location: [facility] ENDOSCOPY;  Service: Gastroenterology;  Laterality: N/A;   ORIF FINGER / THUMB FRACTURE     RHINOPLASTY  2008    SOCIAL HISTORY: Social History   Socioeconomic History   Marital status: Married    Spouse  name: Nathaniel Pratt   Number of children: 1   Years of education: 16   Highest education level: Bachelor's degree (e.g., BA, AB, BS)  Occupational History   Not on file  Tobacco Use   Smoking status: Never   Smokeless tobacco: Never  Vaping Use   Vaping Use: Never used  Substance and Sexual Activity   Alcohol use: Not Currently   Drug use: Never   Sexual activity: Yes  Other Topics Concern   Not on file  Social History Narrative   Not on file   Social Determinants of Health   Financial Resource Strain: Not on file  Food Insecurity: Not on file  Transportation Needs: Not on file  Physical Activity: Not on file  Stress: Not on file  Social Connections: Not on file  Intimate Partner Violence: Not on file    FAMILY HISTORY: Family History  Problem Relation Age of Onset   Hypertension Mother    Allergies Mother    Hypertension Father    Bleeding Disorder Maternal Grandmother    Heart attack Maternal Grandfather    Breast cancer Paternal Grandmother    Clotting disorder Paternal Grandfather     ALLERGIES:  is allergic to amoxicillin and penicillins.  MEDICATIONS:  Current Outpatient Medications  Medication Sig Dispense Refill   albuterol (VENTOLIN HFA) 108 (90 Base) MCG/ACT inhaler Inhale 1 Inhaler into the lungs every 6 (six) hours as needed for shortness of breath or wheezing.     cetirizine (ZYRTEC) 10 MG tablet Take 10 mg by mouth daily.     ferrous sulfate 325 (65 FE) MG tablet Take 325 mg by mouth every other day.     No current facility-administered medications for this visit.    Review of Systems  Constitutional:  Negative for appetite change, chills, fatigue, fever and unexpected weight change.  HENT:   Negative for hearing loss and voice change.   Eyes:  Negative for eye problems and icterus.  Respiratory:  Negative for chest tightness, cough and shortness of breath.   Cardiovascular:  Negative for chest pain and leg swelling.  Gastrointestinal:   Negative for abdominal distention and abdominal pain.  Endocrine: Negative for hot flashes.  Genitourinary:  Negative for difficulty urinating, dysuria and frequency.   Musculoskeletal:  Negative for arthralgias.  Skin:  Negative for itching and rash.  Neurological:  Negative for light-headedness and numbness.  Hematological:  Negative for adenopathy. Does not bruise/bleed easily.  Psychiatric/Behavioral:  Negative for confusion.     PHYSICAL EXAMINATION: ECOG PERFORMANCE STATUS: 0 - Asymptomatic Vitals:   04/13/22 1047  BP: 107/66  Pulse: 88  Resp: 18  Temp: 98.2 F (36.8 C)   Filed Weights   04/13/22 1047  Weight: 194 lb 14.4 oz (88.4 kg)    Physical Exam Constitutional:      General: He is not in acute distress. HENT:     Head: Normocephalic and atraumatic.  Eyes:     General: No scleral icterus. Cardiovascular:     Rate and Rhythm: Normal rate and regular rhythm.     Heart sounds: Normal heart sounds.  Pulmonary:     Effort: Pulmonary effort is normal. No respiratory distress.     Breath sounds: No wheezing.  Abdominal:     General: Bowel  sounds are normal. There is no distension.     Palpations: Abdomen is soft.  Musculoskeletal:        General: No deformity. Normal range of motion.     Cervical back: Normal range of motion and neck supple.  Skin:    General: Skin is warm and dry.     Findings: No erythema or rash.  Neurological:     Mental Status: He is alert and oriented to person, place, and time. Mental status is at baseline.     Cranial Nerves: No cranial nerve deficit.     Coordination: Coordination normal.  Psychiatric:        Mood and Affect: Mood normal.      LABORATORY DATA:  I have reviewed the data as listed    Latest Ref Rng & Units 03/10/2022    9:23 PM 02/17/2022    3:27 PM 02/14/2022    4:32 PM  CBC  WBC 4.0 - 10.5 K/uL 4.9  5.1  5.1   Hemoglobin 13.0 - 17.0 g/dL 13.1  12.7  13.6   Hematocrit 39.0 - 52.0 % 39.6  38.2  40.3    Platelets 150 - 400 K/uL 179  198  211       Latest Ref Rng & Units 03/10/2022    9:23 PM 08/15/2021    4:47 AM 08/14/2021    5:00 AM  CMP  Glucose 70 - 99 mg/dL 99  89  94   BUN 6 - 20 mg/dL _0 Creatinine 0.61 - 1.24 mg/dL 1.06  0.97  0.87   Sodium 135 - 145 mmol/L 133  139  137   Potassium 3.5 - 5.1 mmol/L 3.8  3.9  3.5   Chloride 98 - 111 mmol/L 97  102  104   CO2 22 - 32 mmol/L _1 Calcium 8.9 - 10.3 mg/dL 8.9  8.6  8.2   Total Protein 6.5 - 8.1 g/dL 7.3     Total Bilirubin 0.3 - 1.2 mg/dL 0.8     Alkaline Phos 38 - 126 U/L 52     AST 15 - 41 U/L 27     ALT 0 - 44 U/L 20         Component Value Date/Time   IRON 30 (L) 02/17/2022 1527   TIBC 344 02/17/2022 1527   FERRITIN 16 (L) 02/17/2022 1527   IRONPCTSAT 9 (LL) 02/17/2022 1527     RADIOGRAPHIC STUDIES: I have personally reviewed the radiological images as listed and agreed with the findings in the report. No results found.

## 2022-04-14 NOTE — Assessment & Plan Note (Signed)
Previous Labs are reviewed and discussed with patient. Patient has normal hemoglobin, iron panel is consistent with iron deficiency Likely secondary to blood loss secondary to blood donation Recommend patient to hold off blood donation Take oral iron supplementation Patient deferred to repeat blood work today He will follow-up with primary care provider in a few months and repeat CBC at that point I recommend patient to call back and schedule follow-up appointment if hemoglobin remains decreased despite holding of blood donation and taking oral iron supplementation.

## 2022-06-17 DIAGNOSIS — D509 Iron deficiency anemia, unspecified: Secondary | ICD-10-CM | POA: Diagnosis not present

## 2022-06-20 LAB — CBC WITH DIFFERENTIAL/PLATELET

## 2022-06-20 LAB — IRON,TIBC AND FERRITIN PANEL
Ferritin: 25 ng/mL — ABNORMAL LOW (ref 30–400)
Iron Saturation: 34 % (ref 15–55)
Iron: 108 ug/dL (ref 38–169)
Total Iron Binding Capacity: 316 ug/dL (ref 250–450)
UIBC: 208 ug/dL (ref 111–343)

## 2022-06-21 ENCOUNTER — Ambulatory Visit (INDEPENDENT_AMBULATORY_CARE_PROVIDER_SITE_OTHER): Payer: BC Managed Care – PPO | Admitting: Internal Medicine

## 2022-06-21 ENCOUNTER — Encounter: Payer: Self-pay | Admitting: Internal Medicine

## 2022-06-21 VITALS — BP 116/70 | HR 73 | Ht 72.0 in | Wt 197.0 lb

## 2022-06-21 DIAGNOSIS — Z1159 Encounter for screening for other viral diseases: Secondary | ICD-10-CM | POA: Diagnosis not present

## 2022-06-21 DIAGNOSIS — Z0000 Encounter for general adult medical examination without abnormal findings: Secondary | ICD-10-CM | POA: Diagnosis not present

## 2022-06-21 DIAGNOSIS — Z131 Encounter for screening for diabetes mellitus: Secondary | ICD-10-CM | POA: Diagnosis not present

## 2022-06-21 DIAGNOSIS — E611 Iron deficiency: Secondary | ICD-10-CM

## 2022-06-21 DIAGNOSIS — Z13220 Encounter for screening for lipoid disorders: Secondary | ICD-10-CM | POA: Diagnosis not present

## 2022-06-21 DIAGNOSIS — Z86018 Personal history of other benign neoplasm: Secondary | ICD-10-CM

## 2022-06-21 NOTE — Progress Notes (Signed)
Date:  06/21/2022   Name:  Nathaniel Pratt   DOB:  10-May-1991   MRN:  [id number]   Chief Complaint: Annual Exam Nathaniel Pratt is a 31 y.o. male who presents today for his Complete Annual Exam. He feels well. He reports exercising. He reports he is sleeping well.   Colonoscopy: 04/2021 Flex Sig - rectal tumor spindle cell (genetic testing negative)- final surgical pathology Benign schwannoma.  Follow up capsule study of the small bowel was normal.  Recommended colonoscopy in one year. He prefers not to return to Dr. Vicente Males.  Immunization History  Administered Date(s) Administered   PFIZER(Purple Top)SARS-COV-2 Vaccination 11/16/2019, 12/10/2019   Health Maintenance Due  Topic Date Due   Hepatitis C Screening  Never done   TETANUS/TDAP  Never done    No results found for: "PSA1", "PSA"   Anemia Presents for follow-up visit. There has been no abdominal pain or palpitations. Signs of blood loss that are not present include hematochezia and melena.  Iron studies done yesterday show improvement.  He continues on iron rich foods and iron supplements every other day.  He no longer donates blood.  Lab Results  Component Value Date   NA 133 (L) 03/10/2022   K 3.8 03/10/2022   CO2 28 03/10/2022   GLUCOSE 99 03/10/2022   BUN 25 (H) 03/10/2022   CREATININE 1.06 03/10/2022   CALCIUM 8.9 03/10/2022   EGFR 108 12/09/2020   GFRNONAA >60 03/10/2022   Lab Results  Component Value Date   CHOL 119 12/09/2020   HDL 47 12/09/2020   LDLCALC 55 12/09/2020   TRIG 84 12/09/2020   CHOLHDL 2.5 12/09/2020   No results found for: "TSH" No results found for: "HGBA1C" Lab Results  Component Value Date   WBC CANCELED 06/17/2022   HGB CANCELED 06/17/2022   HCT CANCELED 06/17/2022   MCV 84.3 03/10/2022   PLT CANCELED 06/17/2022   Lab Results  Component Value Date   ALT 20 03/10/2022   AST 27 03/10/2022   ALKPHOS 52 03/10/2022   BILITOT 0.8 03/10/2022   No results found for:  "25OHVITD2", "25OHVITD3", "VD25OH"   Review of Systems  Constitutional:  Negative for appetite change, chills, diaphoresis, fatigue and unexpected weight change.  HENT:  Negative for hearing loss, tinnitus, trouble swallowing and voice change.   Eyes:  Negative for visual disturbance.  Respiratory:  Negative for choking, shortness of breath and wheezing.   Cardiovascular:  Negative for chest pain, palpitations and leg swelling.  Gastrointestinal:  Negative for abdominal pain, blood in stool, constipation, diarrhea, hematochezia and melena.  Genitourinary:  Negative for difficulty urinating, dysuria and frequency.  Musculoskeletal:  Positive for arthralgias (mild patellar tendonitis). Negative for back pain and myalgias.  Skin:  Negative for color change and rash.  Neurological:  Negative for dizziness, syncope and headaches.  Hematological:  Negative for adenopathy.  Psychiatric/Behavioral:  Negative for dysphoric mood and sleep disturbance. The patient is not nervous/anxious.     Patient Active Problem List   Diagnosis Date Noted   Iron deficiency 04/14/2022   History of benign schwannoma 08/13/2021   S/P laparoscopic-assisted sigmoidectomy 08/13/2021   Genetic testing 06/24/2021   Family history of breast cancer 06/10/2021   Environmental and seasonal allergies 12/09/2020    Allergies  Allergen Reactions   Amoxicillin Hives   Penicillins Hives and Itching    Reaction: 18 years    Past Surgical History:  Procedure Laterality Date   COLONOSCOPY WITH PROPOFOL N/A 04/07/2021  Procedure: COLONOSCOPY WITH PROPOFOL;  Surgeon: Jonathon Bellows, MD;  Location: [facility] ENDOSCOPY;  Service: Gastroenterology;  Laterality: N/A;   ESOPHAGOGASTRODUODENOSCOPY (EGD) WITH PROPOFOL N/A 04/07/2021   Procedure: ESOPHAGOGASTRODUODENOSCOPY (EGD) WITH PROPOFOL;  Surgeon: Jonathon Bellows, MD;  Location: [facility] ENDOSCOPY;  Service: Gastroenterology;  Laterality: N/A;   EUS N/A 04/29/2021   Procedure: LOWER ENDOSCOPIC  ULTRASOUND (EUS);  Surgeon: Milus Banister, MD;  Location: [facility] ENDOSCOPY;  Service: Endoscopy;  Laterality: N/A;   FINE NEEDLE ASPIRATION N/A 04/29/2021   Procedure: FINE NEEDLE ASPIRATION (FNA) LINEAR;  Surgeon: Milus Banister, MD;  Location: [facility] ENDOSCOPY;  Service: Endoscopy;  Laterality: N/A;   FLEXIBLE SIGMOIDOSCOPY N/A 04/29/2021   Procedure: FLEXIBLE SIGMOIDOSCOPY;  Surgeon: Milus Banister, MD;  Location: [facility] ENDOSCOPY;  Service: Endoscopy;  Laterality: N/A;   FLEXIBLE SIGMOIDOSCOPY N/A 08/13/2021   Procedure: DIAGNOSTIC FLEXIBLE SIGMOIDOSCOPY;  Surgeon: Ileana Roup, MD;  Location: [facility] ORS;  Service: General;  Laterality: N/A;   GIVENS CAPSULE STUDY N/A 10/12/2021   Procedure: GIVENS CAPSULE STUDY;  Surgeon: Jonathon Bellows, MD;  Location: [facility] ENDOSCOPY;  Service: Gastroenterology;  Laterality: N/A;   ORIF FINGER / THUMB FRACTURE     RHINOPLASTY  2008    Social History   Tobacco Use   Smoking status: Never   Smokeless tobacco: Never  Vaping Use   Vaping Use: Never used  Substance Use Topics   Alcohol use: Not Currently   Drug use: Never     Medication list has been reviewed and updated.  Current Meds  Medication Sig   cetirizine (ZYRTEC) 10 MG tablet Take 10 mg by mouth daily.   ferrous sulfate 325 (65 FE) MG tablet Take 325 mg by mouth every other day.   [DISCONTINUED] albuterol (VENTOLIN HFA) 108 (90 Base) MCG/ACT inhaler Inhale 1 Inhaler into the lungs every 6 (six) hours as needed for shortness of breath or wheezing.       06/21/2022   10:56 AM 12/09/2020    3:18 PM  GAD 7 : Generalized Anxiety Score  Nervous, Anxious, on Edge 0 0  Control/stop worrying 0 0  Worry too much - different things 0 0  Trouble relaxing 0 0  Restless 0 0  Easily annoyed or irritable 0 0  Afraid - awful might happen 0 0  Total GAD 7 Score 0 0  Anxiety Difficulty Not difficult at all        06/21/2022   10:56 AM 12/09/2020    3:17 PM  Depression screen PHQ 2/9  Decreased  Interest 0 0  Down, Depressed, Hopeless 0 0  PHQ - 2 Score 0 0  Altered sleeping 0 0  Tired, decreased energy 1 0  Change in appetite 0 0  Feeling bad or failure about yourself  1 0  Trouble concentrating 0 0  Moving slowly or fidgety/restless 0 0  Suicidal thoughts 0 0  PHQ-9 Score 2 0  Difficult doing work/chores Not difficult at all     BP Readings from Last 3 Encounters:  06/21/22 116/70  04/13/22 107/66  03/11/22 108/63    Physical Exam Vitals and nursing note reviewed.  Constitutional:      Appearance: Normal appearance. He is well-developed.  HENT:     Head: Normocephalic.     Right Ear: Tympanic membrane, ear canal and external ear normal.     Left Ear: Tympanic membrane, ear canal and external ear normal.     Nose: Nose normal.  Eyes:     Conjunctiva/sclera: Conjunctivae  normal.     Pupils: Pupils are equal, round, and reactive to light.  Neck:     Thyroid: No thyromegaly.     Vascular: No carotid bruit.  Cardiovascular:     Rate and Rhythm: Normal rate and regular rhythm.     Heart sounds: Normal heart sounds.  Pulmonary:     Effort: Pulmonary effort is normal.     Breath sounds: Normal breath sounds. No wheezing.  Chest:  Breasts:    Right: No mass.     Left: No mass.  Abdominal:     General: Bowel sounds are normal.     Palpations: Abdomen is soft.     Tenderness: There is no abdominal tenderness.  Musculoskeletal:        General: Normal range of motion.     Cervical back: Normal range of motion and neck supple.  Lymphadenopathy:     Cervical: No cervical adenopathy.  Skin:    General: Skin is warm and dry.  Neurological:     Mental Status: He is alert and oriented to person, place, and time.     Deep Tendon Reflexes: Reflexes are normal and symmetric.  Psychiatric:        Attention and Perception: Attention normal.        Mood and Affect: Mood normal.        Thought Content: Thought content normal.     Wt Readings from Last 3 Encounters:   06/21/22 197 lb (89.4 kg)  04/13/22 194 lb 14.4 oz (88.4 kg)  03/10/22 190 lb (86.2 kg)    BP 116/70   Pulse 73   Ht 6' (1.829 m)   Wt 197 lb (89.4 kg)   SpO2 96%   BMI 26.72 kg/m   Assessment and Plan: 1. Annual physical exam Normal exam. Continue healthy diet and regular exercise. No HM measures due. - CBC with Differential/Platelet - Comprehensive metabolic panel - Hemoglobin A1c - Lipid panel  2. Screening for diabetes mellitus - Hemoglobin A1c  3. Screening for lipid disorders - Lipid panel  4. Iron deficiency Recent iron studies improved but CBC was cancelled. Will recheck and advise - CBC with Differential/Platelet  5. History of benign schwannoma Recommended to have Colonoscopy again in one year Will refer to Dr. Ardis Hughs in [place] per pt request - Ambulatory referral to Gastroenterology  6. Need for hepatitis C screening test - Hepatitis C Antibody   Partially dictated using Editor, commissioning. Any errors are unintentional.  Halina Maidens, MD [facility]  06/21/2022

## 2022-06-22 LAB — CBC WITH DIFFERENTIAL/PLATELET
Basophils Absolute: 0 10*3/uL (ref 0.0–0.2)
Basos: 1 %
EOS (ABSOLUTE): 0.2 10*3/uL (ref 0.0–0.4)
Eos: 3 %
Hematocrit: 44.2 % (ref 37.5–51.0)
Hemoglobin: 14.5 g/dL (ref 13.0–17.7)
Immature Grans (Abs): 0 10*3/uL (ref 0.0–0.1)
Immature Granulocytes: 0 %
Lymphocytes Absolute: 1.5 10*3/uL (ref 0.7–3.1)
Lymphs: 30 %
MCH: 28.5 pg (ref 26.6–33.0)
MCHC: 32.8 g/dL (ref 31.5–35.7)
MCV: 87 fL (ref 79–97)
Monocytes Absolute: 0.5 10*3/uL (ref 0.1–0.9)
Monocytes: 9 %
Neutrophils Absolute: 2.9 10*3/uL (ref 1.4–7.0)
Neutrophils: 57 %
Platelets: 225 10*3/uL (ref 150–450)
RBC: 5.08 x10E6/uL (ref 4.14–5.80)
RDW: 13.3 % (ref 11.6–15.4)
WBC: 5 10*3/uL (ref 3.4–10.8)

## 2022-06-22 LAB — LIPID PANEL
Chol/HDL Ratio: 2.9 ratio (ref 0.0–5.0)
Cholesterol, Total: 128 mg/dL (ref 100–199)
HDL: 44 mg/dL (ref >39)
LDL Chol Calc (NIH): 71 mg/dL (ref 0–99)
Triglycerides: 61 mg/dL (ref 0–149)
VLDL Cholesterol Cal: 13 mg/dL (ref 5–40)

## 2022-06-22 LAB — COMPREHENSIVE METABOLIC PANEL
ALT: 13 IU/L (ref 0–44)
AST: 18 IU/L (ref 0–40)
Albumin/Globulin Ratio: 1.9 (ref 1.2–2.2)
Albumin: 4.6 g/dL (ref 4.1–5.1)
Alkaline Phosphatase: 62 IU/L (ref 44–121)
BUN/Creatinine Ratio: 19 (ref 9–20)
BUN: 19 mg/dL (ref 6–20)
Bilirubin Total: 0.3 mg/dL (ref 0.0–1.2)
CO2: 25 mmol/L (ref 20–29)
Calcium: 9.3 mg/dL (ref 8.7–10.2)
Chloride: 103 mmol/L (ref 96–106)
Creatinine, Ser: 1.02 mg/dL (ref 0.76–1.27)
Globulin, Total: 2.4 g/dL (ref 1.5–4.5)
Glucose: 86 mg/dL (ref 70–99)
Potassium: 4.7 mmol/L (ref 3.5–5.2)
Sodium: 141 mmol/L (ref 134–144)
Total Protein: 7 g/dL (ref 6.0–8.5)
eGFR: 101 mL/min/{1.73_m2} (ref >59)

## 2022-06-22 LAB — HEMOGLOBIN A1C
Est. average glucose Bld gHb Est-mCnc: 111 mg/dL
Hgb A1c MFr Bld: 5.5 % (ref 4.8–5.6)

## 2022-06-22 LAB — HEPATITIS C ANTIBODY: Hep C Virus Ab: NONREACTIVE

## 2022-09-06 NOTE — Patient Instructions (Signed)
Pre-Vasectomy Instructions  STOP all aspirin or blood thinners (Aspirin, Plavix, Coumadin, Warfarin, Motrin, Ibuprofen, Advil, Aleve, Naproxen, Naprosyn) for 7 days prior to the procedure.  If you have any questions about stopping these medications please contact your primary care physician or cardiologist.  Shave all hair from the upper scrotum on the day of the procedure.  This means just under the penis onto the scrotal sac.  The area shaved should measure about 2-3 inches around.  You may lather the scrotum with soap and water, and shave with a safety razor.  After shaving the area, thoroughly wash the penis and the scrotum, then shower or bathe to remove all the loose hairs.  If needed, wash the area again just before coming in for your Vasectomy.  It is recommended to have a light meal an hour or so prior to the procedure.  Bring a scrotal support (jock strap or suspensory, or tight jockey shorts or underwear).  Wear comfortable pants or shorts.  While the actual procedure usually takes about 45 minutes, you should be prepared to stay in the office for approximately one hour.  Bring someone with you to drive you home.  If you have any questions or concerns, please feel free to call the office at [phone].   Post Vasectomy After Care This sheet gives you information about how to care for yourself after your procedure.  What can I expect after the procedure? After the procedure, it is common to have: Mild/moderate pain and discomfort. If you have pain or discomfort immediately after the vasectomy, you may use OTC pain medication for relief, ex: Tylenol, Ibuprofen. After the local anesthetic wears off an ice pack may help to provide additional comfort and can also prevent swelling. Do not place ice pack directly on your skin.  Swelling of your scrotum/testicles or or slight redness on your scrotum/testicles around the incision site. Black and Blue bruising as the tissue heals Some  slight blood/drainage coming from your incisions or puncture sites for 1 or 2 days. If you have stitches placed they do not need to be removed, they will dissolve on their own after time.  Edges of the incision may come apart and heal slowly, sometimes a knot may be present which can remain for several months. This is normal and part of the healing process Blood in your semen. Follow these instructions at home: Activity For the first 48-72 hours after the procedure, avoid physical activity and exercise that requires heavy lifting. (5-10lbs) Do not take part in sports or perform heavy physical labor until your pain has improved, or until your health care provider says it is okay. You may have limits on the amount of weight you can lift as told by your health care provider. Do not ejaculate for at least 1 week after the procedure, or for as long as you are told. You may resume sexual activity 7-10 days after your procedure, or when your health care provider approves. Use a different method of birth control (contraception) until you have had test results that confirm that there is no sperm in your semen.  Wound Care: Shower only after 24 hours No tub baths, hot tub, or pools for at least 7 days Scrotal support Use scrotal support, such as a jockstrap or underwear with a supportive pouch, as needed for 1 week after your procedure. If you feel discomfort in your scrotum, you may remove the scrotal support to see if the discomfort is relieved. Sometimes scrotal support  can press on the scrotum and cause or worsen discomfort. If your skin gets irritated, you may add some germ-free (sterile), fluffed bandages or a clean washcloth to the scrotal support. Managing pain and swelling If directed, put ice on the affected area. To do this: Put ice in a plastic bag. Place a towel between your skin and the bag. Leave the ice on for 20 minutes, 2-3 times a day. Remove the ice if your skin turns bright red.  This is very important. If you cannot feel pain, heat, or cold, you have a greater risk of damage to the area.      TO DO:  10-15 Ejaculations to help clear the passage of sperm, you must be sure to use another form of birth control until you are told you may discontinue use!! You will be given a specimen cup to bring back a semen sample in 3 months for analysis, will call with results Contact a health care provider if: You have pus or a bad smell coming from an incision or puncture site. You have a fever of 101 or greater  Significant drainage or bleeding from the incision site Generalized redness of scrotum

## 2022-09-07 ENCOUNTER — Ambulatory Visit (INDEPENDENT_AMBULATORY_CARE_PROVIDER_SITE_OTHER): Payer: BC Managed Care – PPO | Admitting: Urology

## 2022-09-07 VITALS — BP 110/69 | HR 76 | Ht 72.0 in | Wt 200.2 lb

## 2022-09-07 DIAGNOSIS — Z3009 Encounter for other general counseling and advice on contraception: Secondary | ICD-10-CM | POA: Diagnosis not present

## 2022-09-07 MED ORDER — DIAZEPAM 10 MG PO TABS: 10.0000 mg | ORAL_TABLET | Freq: Once | ORAL | 0 refills | Status: AC

## 2022-09-07 NOTE — Progress Notes (Signed)
09/07/2022 9:33 AM   Nathaniel Pratt 1990-10-02 [id number]  Referring provider: Glean Hess, MD [address] [place],  [place] [address]  Chief Complaint  Patient presents with   vasectomy consult    HPI: 32 y.o. year old male referred for further evaluation of possible vasectomy.  He denies a history of testicular trauma or pain.  No urinary issues.  No previous scrotal surgeries.  He has one biological child and his wife is out on maternity leave currently.  They both desire vasectomy.   PMH: Past Medical History:  Diagnosis Date   Allergy    Anemia    Asthma    Broken nose 2008   Family history of breast cancer    Thumb fracture 02/2009    Surgical History: Past Surgical History:  Procedure Laterality Date   COLONOSCOPY WITH PROPOFOL N/A 04/07/2021   Procedure: COLONOSCOPY WITH PROPOFOL;  Surgeon: Jonathon Bellows, MD;  Location: [facility] ENDOSCOPY;  Service: Gastroenterology;  Laterality: N/A;   ESOPHAGOGASTRODUODENOSCOPY (EGD) WITH PROPOFOL N/A 04/07/2021   Procedure: ESOPHAGOGASTRODUODENOSCOPY (EGD) WITH PROPOFOL;  Surgeon: Jonathon Bellows, MD;  Location[facility] ENDOSCOPY;  Service: Gastroenterology;  Laterality: N/A;   EUS N/A 04/29/2021   Procedure: LOWER ENDOSCOPIC ULTRASOUND (EUS);  Surgeon: Milus Banister, MD;  Location: [facility] ENDOSCOPY;  Service: Endoscopy;  Laterality: N/A;   FINE NEEDLE ASPIRATION N/A 04/29/2021   Procedure: FINE NEEDLE ASPIRATION (FNA) LINEAR;  Surgeon: Milus Banister, MD;  Location: [facility] ENDOSCOPY;  Service: Endoscopy;  Laterality: N/A;   FLEXIBLE SIGMOIDOSCOPY N/A 04/29/2021   Procedure: FLEXIBLE SIGMOIDOSCOPY;  Surgeon: Milus Banister, MD;  Location: [facility] ENDOSCOPY;  Service: Endoscopy;  Laterality: N/A;   FLEXIBLE SIGMOIDOSCOPY N/A 08/13/2021   Procedure: DIAGNOSTIC FLEXIBLE SIGMOIDOSCOPY;  Surgeon: Ileana Roup, MD;  Location: [facility] ORS;  Service: General;  Laterality: N/A;   GIVENS CAPSULE STUDY N/A 10/12/2021   Procedure: GIVENS  CAPSULE STUDY;  Surgeon: Jonathon Bellows, MD;  Location: [facility] ENDOSCOPY;  Service: Gastroenterology;  Laterality: N/A;   ORIF FINGER / THUMB FRACTURE     RHINOPLASTY  2008    Home Medications:  Allergies as of 09/07/2022       Reactions   Amoxicillin Hives   Penicillins Hives, Itching   Reaction: 18 years        Medication List        Accurate as of September 07, 2022  9:33 AM. If you have any questions, ask your nurse or doctor.          cetirizine 10 MG tablet Commonly known as: ZYRTEC Take 10 mg by mouth daily.   ferrous sulfate 325 (65 FE) MG tablet Take 325 mg by mouth every other day.        Allergies:  Allergies  Allergen Reactions   Amoxicillin Hives   Penicillins Hives and Itching    Reaction: 18 years    Family History: Family History  Problem Relation Age of Onset   Hypertension Mother    Allergies Mother    Hypertension Father    Bleeding Disorder Maternal Grandmother    Heart attack Maternal Grandfather    Breast cancer Paternal Grandmother    Clotting disorder Paternal Grandfather     Social History:  reports that he has never smoked. He has never used smokeless tobacco. He reports that he does not currently use alcohol. He reports that he does not use drugs.   Physical Exam: BP 110/69   Pulse 76   Ht 6' (1.829  m)   Wt 200 lb 4 oz (90.8 kg)   BMI 27.16 kg/m   Constitutional:  Alert and oriented, No acute distress. HEENT: [place] AT, moist mucus membranes.  Trachea midline, no masses. Cardiovascular: No clubbing, cyanosis, or edema. Respiratory: Normal respiratory effort, no increased work of breathing. GI: Abdomen is soft, nontender, nondistended, no abdominal masses GU: Normal phallus.  Bilateral descended testicles without masses.  Vasa easily palpable bilaterally. Skin: No rashes, bruises or suspicious lesions. Neurologic: Grossly intact, no focal deficits, moving all 4 extremities. Psychiatric: Normal mood and affect.   Assessment &  Plan:    1. Vasectomy evaluation Today, we discussed what the vas deferens is, where it is located, and its function. We reviewed the procedure for vasectomy, it's risks, benefits, alternatives, and likelihood of achieving his goals. We discussed in detail the procedure, complications, and recovery as well as the need for clearance prior to unprotected intercourse. We discussed that vasectomy does not protect against sexually transmitted diseases. We discussed that this procedure does not result in immediate sterility and that they would need to use other forms of birth control until he has been cleared with negative postvasectomy semen analyses. I explained that the procedure is considered to be permanent and that attempts at reversal have varying degrees of success. These options include vasectomy reversal, sperm retrieval, and in vitro fertilization; these can be very expensive. We discussed the chance of postvasectomy pain syndrome which occurs in less than 5% of patients. I explained to the patient that there is no treatment to resolve this chronic pain, and that if it developed I would not be able to help resolve the issue, but that surgery is generally not needed for correction. I explained there have even been reports of systemic like illness associated with this chronic pain, and that there was no good cure. I explained that vasectomy it is not a 100% reliable form of birth control, and the risk of pregnancy after vasectomy is approximately 1 in 2000 men who had a negative postvasectomy semen analysis or rare non-motile sperm. I explained that repeat vasectomy was necessary in less than 1% of vasectomy procedures when employing the type of technique that I use. I explained that he should refrain from ejaculation for approximately one week following vasectomy. I explained that there are other options for birth control which are permanent and non-permanent; we discussed these. I explained the rates of  surgical complications, such as symptomatic hematoma or infection, are low (1-2%) and vary with the surgeon's experience and criteria used to diagnose the complication.   The patient had the opportunity to ask questions to his stated satisfaction. He voiced understanding of the above factors and stated that he has read all the information provided to him and the packets and informed consent.  He is interested in receiving of Valium 10 mg prior to the procedure for the purpose of anxiolysis.  A prescription was given today.  He will have a driver on the day of the procedure.    Hollice Espy, MD  [facility] [address] [place], [place] [address] [phone]

## 2022-09-20 DIAGNOSIS — D3610 Benign neoplasm of peripheral nerves and autonomic nervous system, unspecified: Secondary | ICD-10-CM | POA: Diagnosis not present

## 2022-10-01 ENCOUNTER — Ambulatory Visit: Admit: 2022-10-01 | Discharge status: Against Medical Advice | Payer: BC Managed Care – PPO

## 2022-10-02 ENCOUNTER — Ambulatory Visit
Admission: EM | Admit: 2022-10-02 | Discharge: 2022-10-02 | Disposition: A | Discharge status: Home / Self Care | Payer: BC Managed Care – PPO

## 2022-10-02 VITALS — BP 111/76 | HR 88 | Temp 98.0°F | Resp 18

## 2022-10-02 DIAGNOSIS — J069 Acute upper respiratory infection, unspecified: Secondary | ICD-10-CM | POA: Diagnosis not present

## 2022-10-02 DIAGNOSIS — J029 Acute pharyngitis, unspecified: Secondary | ICD-10-CM

## 2022-10-02 LAB — POCT RAPID STREP A (OFFICE): Rapid Strep A Screen: NEGATIVE

## 2022-10-02 NOTE — Discharge Instructions (Addendum)
Your strep test is negative.    Tylenol or ibuprofen as needed for fever or discomfort.  Take Mucinex as needed for congestion.  Follow up with your primary care provider if your symptoms are not improving.

## 2022-10-02 NOTE — ED Provider Notes (Signed)
[name]    CSN: [id number] Arrival date & time: 10/02/22  8563      History   Chief Complaint Chief Complaint  Patient presents with   Sore Throat    Dry cough since Thursday. Sore throat since Saturday morning. Loss of appetite. Had a fever Thursday but it broke. Similar feeling to when I had strep around this time last year - Entered by patient    HPI Briana Newman is a 32 y.o. male.   Patient presents with sore throat and cough x 3 days.  He reports low grade temp of 100 at the onset of his symptoms but none since.  He denies rash, ear pain, chest pain, shortness of breath, vomiting, diarrhea, or other symptoms.  No OTC medications taken today.  Negative COVID test at home.  His medical history includes asthma, allergies, anemia.   The history is provided by the patient and medical records.    Past Medical History:  Diagnosis Date   Allergy    Anemia    Asthma    Broken nose 2008   Family history of breast cancer    Thumb fracture 02/2009    Patient Active Problem List   Diagnosis Date Noted   Iron deficiency 04/14/2022   History of benign schwannoma 08/13/2021   S/P laparoscopic-assisted sigmoidectomy 08/13/2021   Genetic testing 06/24/2021   Family history of breast cancer 06/10/2021   Environmental and seasonal allergies 12/09/2020    Past Surgical History:  Procedure Laterality Date   COLONOSCOPY WITH PROPOFOL N/A 04/07/2021   Procedure: COLONOSCOPY WITH PROPOFOL;  Surgeon: Jonathon Bellows, MD;  Location: [facility] ENDOSCOPY;  Service: Gastroenterology;  Laterality: N/A;   ESOPHAGOGASTRODUODENOSCOPY (EGD) WITH PROPOFOL N/A 04/07/2021   Procedure: ESOPHAGOGASTRODUODENOSCOPY (EGD) WITH PROPOFOL;  Surgeon: Jonathon Bellows, MD;  Location: [facility] ENDOSCOPY;  Service: Gastroenterology;  Laterality: N/A;   EUS N/A 04/29/2021   Procedure: LOWER ENDOSCOPIC ULTRASOUND (EUS);  Surgeon: Milus Banister, MD;  Location: [facility] ENDOSCOPY;  Service: Endoscopy;  Laterality: N/A;    FINE NEEDLE ASPIRATION N/A 04/29/2021   Procedure: FINE NEEDLE ASPIRATION (FNA) LINEAR;  Surgeon: Milus Banister, MD;  Location: [facility] ENDOSCOPY;  Service: Endoscopy;  Laterality: N/A;   FLEXIBLE SIGMOIDOSCOPY N/A 04/29/2021   Procedure: FLEXIBLE SIGMOIDOSCOPY;  Surgeon: Milus Banister, MD;  Location: [facility] ENDOSCOPY;  Service: Endoscopy;  Laterality: N/A;   FLEXIBLE SIGMOIDOSCOPY N/A 08/13/2021   Procedure: DIAGNOSTIC FLEXIBLE SIGMOIDOSCOPY;  Surgeon: Ileana Roup, MD;  Location: [facility] ORS;  Service: General;  Laterality: N/A;   GIVENS CAPSULE STUDY N/A 10/12/2021   Procedure: GIVENS CAPSULE STUDY;  Surgeon: Jonathon Bellows, MD;  Location: [facility] ENDOSCOPY;  Service: Gastroenterology;  Laterality: N/A;   ORIF FINGER / THUMB FRACTURE     RHINOPLASTY  2008       Home Medications    Prior to Admission medications   Medication Sig Start Date End Date Taking? Authorizing Provider  diazepam (VALIUM) 10 MG tablet PLEASE SEE ATTACHED FOR DETAILED DIRECTIONS 09/07/22  Yes [provider]  cetirizine (ZYRTEC) 10 MG tablet Take 10 mg by mouth daily. 12/04/20   [provider]  ferrous sulfate 325 (65 FE) MG tablet Take 325 mg by mouth every other day.    [provider]    Family History Family History  Problem Relation Age of Onset   Hypertension Mother    Allergies Mother    Hypertension Father    Bleeding Disorder Maternal Grandmother    Heart attack Maternal  Grandfather    Breast cancer Paternal Grandmother    Clotting disorder Paternal Grandfather     Social History Social History   Tobacco Use   Smoking status: Never   Smokeless tobacco: Never  Vaping Use   Vaping Use: Never used  Substance Use Topics   Alcohol use: Not Currently   Drug use: Never     Allergies   Amoxicillin and Penicillins   Review of Systems Review of Systems  Constitutional:  Negative for chills and fever.  HENT:  Positive for sore throat. Negative for ear pain.   Respiratory:   Positive for cough. Negative for shortness of breath and wheezing.   Cardiovascular:  Negative for chest pain and palpitations.  Gastrointestinal:  Negative for diarrhea and vomiting.  Skin:  Negative for rash.  All other systems reviewed and are negative.    Physical Exam Triage Vital Signs ED Triage Vitals  Enc Vitals Group     BP 10/02/22 0941 111/76     Pulse Rate 10/02/22 0935 88     Resp 10/02/22 0935 18     Temp 10/02/22 0935 98 F (36.7 C)     Temp src --      SpO2 10/02/22 0935 95 %     Weight --      Height --      Head Circumference --      Peak Flow --      Pain Score 10/02/22 0937 5     Pain Loc --      Pain Edu? --      Excl. in [place]? --    No data found.  Updated Vital Signs BP 111/76   Pulse 88   Temp 98 F (36.7 C)   Resp 18   SpO2 95%   Visual Acuity Right Eye Distance:   Left Eye Distance:   Bilateral Distance:    Right Eye Near:   Left Eye Near:    Bilateral Near:     Physical Exam Vitals and nursing note reviewed.  Constitutional:      General: He is not in acute distress.    Appearance: Normal appearance. He is well-developed. He is not ill-appearing.  HENT:     Right Ear: Tympanic membrane normal.     Left Ear: Tympanic membrane normal.     Nose: Nose normal.     Mouth/Throat:     Mouth: Mucous membranes are moist.     Pharynx: Posterior oropharyngeal erythema present.     Comments: Mild erythema and clear PND. Cardiovascular:     Rate and Rhythm: Normal rate and regular rhythm.     Heart sounds: Normal heart sounds.  Pulmonary:     Effort: Pulmonary effort is normal. No respiratory distress.     Breath sounds: Normal breath sounds. No wheezing.     Comments: Good air movement, no wheezing.  Musculoskeletal:     Cervical back: Neck supple.  Skin:    General: Skin is warm and dry.  Neurological:     Mental Status: He is alert.  Psychiatric:        Mood and Affect: Mood normal.        Behavior: Behavior normal.      UC  Treatments / Results  Labs (all labs ordered are listed, but only abnormal results are displayed) Labs Reviewed  POCT RAPID STREP A (OFFICE)    EKG   Radiology No results found.  Procedures Procedures (including critical care time)  Medications  Ordered in UC Medications - No data to display  Initial Impression / Assessment and Plan / UC Course  I have reviewed the triage vital signs and the nursing notes.  Pertinent labs & imaging results that were available during my care of the patient were reviewed by me and considered in my medical decision making (see chart for details).    Viral URI, viral pharyngitis.  Rapid strep negative.  Patient declines COVID test today as he had a negative test at home.  Discussed symptomatic treatment including Tylenol or ibuprofen, Mucinex, rest, hydration.  Instructed patient to follow up with his PCP if symptoms are not improving.  He agrees to plan of care.   Final Clinical Impressions(s) / UC Diagnoses   Final diagnoses:  Viral URI  Viral pharyngitis     Discharge Instructions      Your strep test is negative.    Tylenol or ibuprofen as needed for fever or discomfort.  Take Mucinex as needed for congestion.  Follow up with your primary care provider if your symptoms are not improving.        ED Prescriptions   None    PDMP not reviewed this encounter.   Sharion Balloon, NP 10/02/22 1012

## 2022-10-02 NOTE — ED Triage Notes (Signed)
Patient to Urgent Care with complaints of dry cough that started on Thursday. Also with complaints of sore throat that started on Saturday morning.  Fever on Thursday. Max temp 100.

## 2022-10-20 ENCOUNTER — Other Ambulatory Visit: Payer: Self-pay

## 2022-10-20 DIAGNOSIS — K6289 Other specified diseases of anus and rectum: Secondary | ICD-10-CM

## 2022-10-20 MED ORDER — NA SULFATE-K SULFATE-MG SULF 17.5-3.13-1.6 GM/177ML PO SOLN: 354.0000 mL | Freq: Once | ORAL | 0 refills | Status: AC

## 2022-10-26 ENCOUNTER — Ambulatory Visit (INDEPENDENT_AMBULATORY_CARE_PROVIDER_SITE_OTHER): Payer: BC Managed Care – PPO | Admitting: Urology

## 2022-10-26 VITALS — BP 122/69 | HR 74

## 2022-10-26 DIAGNOSIS — Z9852 Vasectomy status: Secondary | ICD-10-CM

## 2022-10-26 DIAGNOSIS — Z302 Encounter for sterilization: Secondary | ICD-10-CM | POA: Diagnosis not present

## 2022-10-26 NOTE — Progress Notes (Signed)
10/26/22  CC:  Chief Complaint  Patient presents with   Sterilization    HPI: 32 yo M who presents for vasectomy.  Blood pressure 122/69, pulse 74. NED. A&Ox3.   No respiratory distress   Abd soft, NT, ND Normal external genitalia with patent urethral meatus  A timeout was performed.  Patient's identity and consent was confirmed.  All questions were answered.   Bilateral Vasectomy Procedure  Pre-Procedure: - Patient's scrotum was prepped and draped for vasectomy. - The vas was palpated through the scrotal skin on the left. - 1% Xylocaine was injected into the skin and surrounding tissue for placement  - In a similar manner, the vas on the right was identified, anesthetized, and stabilized.  Procedure: - A #11 blade was used to make a small stab incision in the skin overlying the vas - The left vas was isolated and brought up through the incision exposing that structure. - Bleeding points were cauterized as they occurred. - The vas was free from the surrounding structures and brought to the view. - A segment was positioned for placement with a hemostat. - A second hemostat was placed and a small segment between the two hemostats and was removed for inspection. - Each end of the transected vas lumen was fulgurated/ obliterated using needlepoint electrocautery -A fascial interposition was performed on testicular end of the vas using #3-0 chromic suture -The same procedure was performed on the right. - A single suture of #3-0 chromic catgut was used to close each lateral scrotal skin incision - A dressing was applied.  Post-Procedure: - Patient was instructed in care of the operative area - A specimen is to be delivered in 12 weeks   -Another form of contraception is to be used until post vasectomy semen analysis  Hollice Espy, MD

## 2022-11-01 ENCOUNTER — Encounter: Payer: Self-pay | Admitting: Gastroenterology

## 2022-11-02 ENCOUNTER — Ambulatory Visit: Payer: BC Managed Care – PPO | Admitting: Certified Registered"

## 2022-11-02 ENCOUNTER — Other Ambulatory Visit: Payer: Self-pay

## 2022-11-02 ENCOUNTER — Encounter: Payer: Self-pay | Admitting: Gastroenterology

## 2022-11-02 ENCOUNTER — Ambulatory Visit
Admission: RE | Admit: 2022-11-02 | Discharge: 2022-11-02 | Disposition: A | Discharge status: Home / Self Care | Payer: BC Managed Care – PPO | Attending: Gastroenterology | Admitting: Gastroenterology

## 2022-11-02 ENCOUNTER — Encounter
Admission: RE | Disposition: A | Discharge status: Home / Self Care | Payer: Self-pay | Source: Home / Self Care | Attending: Gastroenterology

## 2022-11-02 DIAGNOSIS — Z86018 Personal history of other benign neoplasm: Secondary | ICD-10-CM

## 2022-11-02 DIAGNOSIS — Z1211 Encounter for screening for malignant neoplasm of colon: Secondary | ICD-10-CM | POA: Insufficient documentation

## 2022-11-02 DIAGNOSIS — J45909 Unspecified asthma, uncomplicated: Secondary | ICD-10-CM | POA: Diagnosis not present

## 2022-11-02 DIAGNOSIS — D126 Benign neoplasm of colon, unspecified: Secondary | ICD-10-CM

## 2022-11-02 DIAGNOSIS — K635 Polyp of colon: Secondary | ICD-10-CM | POA: Diagnosis not present

## 2022-11-02 DIAGNOSIS — Z86010 Personal history of colonic polyps: Secondary | ICD-10-CM | POA: Diagnosis not present

## 2022-11-02 DIAGNOSIS — K5140 Inflammatory polyps of colon without complications: Secondary | ICD-10-CM | POA: Insufficient documentation

## 2022-11-02 DIAGNOSIS — Z980 Intestinal bypass and anastomosis status: Secondary | ICD-10-CM | POA: Insufficient documentation

## 2022-11-02 DIAGNOSIS — K6289 Other specified diseases of anus and rectum: Secondary | ICD-10-CM | POA: Diagnosis not present

## 2022-11-02 DIAGNOSIS — T7840XA Allergy, unspecified, initial encounter: Secondary | ICD-10-CM | POA: Diagnosis not present

## 2022-11-02 HISTORY — PX: COLONOSCOPY WITH PROPOFOL: SHX5780

## 2022-11-02 SURGERY — COLONOSCOPY WITH PROPOFOL
Anesthesia: General

## 2022-11-02 MED ORDER — LIDOCAINE HCL (PF) 2 % IJ SOLN
INTRAMUSCULAR | Status: AC
  Filled 2022-11-02: qty 5

## 2022-11-02 MED ORDER — PROPOFOL 10 MG/ML IV BOLUS
INTRAVENOUS | Status: DC | PRN
  Administered 2022-11-02: 150 ug/kg/min via INTRAVENOUS
  Administered 2022-11-02: 140 mg via INTRAVENOUS

## 2022-11-02 MED ORDER — PROPOFOL 10 MG/ML IV BOLUS
INTRAVENOUS | Status: AC
  Filled 2022-11-02: qty 40

## 2022-11-02 MED ORDER — LIDOCAINE HCL (CARDIAC) PF 100 MG/5ML IV SOSY
PREFILLED_SYRINGE | INTRAVENOUS | Status: DC | PRN
  Administered 2022-11-02: 100 mg via INTRAVENOUS

## 2022-11-02 MED ORDER — SODIUM CHLORIDE 0.9 % IV SOLN: INTRAVENOUS | Status: DC

## 2022-11-02 NOTE — Op Note (Signed)
[facility] Patient Name: Nathaniel Pratt Procedure Date: 11/02/2022 11:03 AM MRN: [id number] Account #: [id number] Date of Birth: 03-Dec-1990 Admit Type: Outpatient Age: 32 Room: [facility] ENDO ROOM 3 Gender: Male Note Status: Finalized Instrument Name: Park Meo [id number] Procedure:             Colonoscopy Indications:           High risk colon cancer surveillance: Personal history                         of colonic polyps, Last colonoscopy: August 2022 Providers:             Jonathon Bellows MD, MD Referring MD:          Halina Maidens, MD (Referring MD) Medicines:             Monitored Anesthesia Care Complications:         No immediate complications. Procedure:             Pre-Anesthesia Assessment:                        - Prior to the procedure, a History and Physical was                         performed, and patient medications, allergies and                         sensitivities were reviewed. The patient's tolerance                         of previous anesthesia was reviewed.                        - The risks and benefits of the procedure and the                         sedation options and risks were discussed with the                         patient. All questions were answered and informed                         consent was obtained.                        - ASA Grade Assessment: II - A patient with mild                         systemic disease.                        After obtaining informed consent, the colonoscope was                         passed under direct vision. Throughout the procedure,                         the patient's blood pressure, pulse, and oxygen  saturations were monitored continuously. The                         Colonoscope was introduced through the anus and                         advanced to the the cecum, identified by the                         appendiceal orifice. The colonoscopy was performed                          with ease. The patient tolerated the procedure well.                         The quality of the bowel preparation was excellent.                         The ileocecal valve, appendiceal orifice, and rectum                         were photographed. Findings:      The perianal and digital rectal examinations were normal.      There was evidence of a prior end-to-end colo-rectal anastomosis in the       sigmoid colon. This was patent and was characterized by erythema. This       was biopsied with a cold forceps for histology.      A 7 mm polyp was found in the anastomosis. The polyp was sessile. The       polyp was removed with a cold snare. Resection and retrieval were       complete.      The exam was otherwise without abnormality on direct and retroflexion       views. Impression:            - Patent end-to-end colo-rectal anastomosis,                         characterized by erythema. Biopsied.                        - One 7 mm polyp at the anastomosis, removed with a                         cold snare. Resected and retrieved.                        - The examination was otherwise normal on direct and                         retroflexion views. Recommendation:        - Discharge patient to home (with escort).                        - Resume previous diet.                        - Continue present medications.                        -  Await pathology results.                        - Repeat colonoscopy for surveillance based on                         pathology results. Procedure Code(s):     --- Professional ---                        [phone], Colonoscopy, flexible; with removal of                         tumor(s), polyp(s), or other lesion(s) by snare                         technique                        [id number], 67, Colonoscopy, flexible; with biopsy, single                         or multiple Diagnosis Code(s):     --- Professional ---                         Z86.010, Personal history of colonic polyps                        Z98.0, Intestinal bypass and anastomosis status                        D12.6, Benign neoplasm of colon, unspecified CPT copyright 2022 American Medical Association. All rights reserved. The codes documented in this report are preliminary and upon coder review may  be revised to meet current compliance requirements. Jonathon Bellows, MD Jonathon Bellows MD, MD 11/02/2022 11:29:43 AM This report has been signed electronically. Number of Addenda: 0 Note Initiated On: 11/02/2022 11:03 AM Scope Withdrawal Time: 0 hours 12 minutes 35 seconds  Total Procedure Duration: 0 hours 14 minutes 56 seconds  Estimated Blood Loss:  Estimated blood loss: none.      [facility]

## 2022-11-02 NOTE — Anesthesia Postprocedure Evaluation (Signed)
Anesthesia Post Note  Patient: Nathaniel Pratt  Procedure(s) Performed: COLONOSCOPY WITH PROPOFOL  Patient location during evaluation: Endoscopy Anesthesia Type: General Level of consciousness: awake and alert Pain management: pain level controlled Vital Signs Assessment: post-procedure vital signs reviewed and stable Respiratory status: spontaneous breathing, nonlabored ventilation, respiratory function stable and patient connected to nasal cannula oxygen Cardiovascular status: blood pressure returned to baseline and stable Postop Assessment: no apparent nausea or vomiting Anesthetic complications: no  No notable events documented.   Last Vitals:  Vitals:   11/02/22 1140 11/02/22 1150  BP: 108/60 113/82  Pulse:    Resp:    Temp:    SpO2:      Last Pain:  Vitals:   11/02/22 1150  TempSrc:   PainSc: 0-No pain                 Dimas Millin

## 2022-11-02 NOTE — H&P (Signed)
Jonathon Bellows, MD [address], [place], [place], [place], [address] [address], [place], [place], [place], [address] Phone: [phone]  Fax: [phone]  Primary Care Physician:  Glean Hess, MD   Pre-Procedure History & Physical: HPI:  Nathaniel Pratt is a 32 y.o. male is here for an colonoscopy.   Past Medical History:  Diagnosis Date   Allergy    Anemia    Asthma    Broken nose 2008   Family history of breast cancer    Thumb fracture 02/2009    Past Surgical History:  Procedure Laterality Date   COLONOSCOPY WITH PROPOFOL N/A 04/07/2021   Procedure: COLONOSCOPY WITH PROPOFOL;  Surgeon: Jonathon Bellows, MD;  Location: [facility] ENDOSCOPY;  Service: Gastroenterology;  Laterality: N/A;   ESOPHAGOGASTRODUODENOSCOPY (EGD) WITH PROPOFOL N/A 04/07/2021   Procedure: ESOPHAGOGASTRODUODENOSCOPY (EGD) WITH PROPOFOL;  Surgeon: Jonathon Bellows, MD;  Location: [facility] ENDOSCOPY;  Service: Gastroenterology;  Laterality: N/A;   EUS N/A 04/29/2021   Procedure: LOWER ENDOSCOPIC ULTRASOUND (EUS);  Surgeon: Milus Banister, MD;  Location: [facility] ENDOSCOPY;  Service: Endoscopy;  Laterality: N/A;   FINE NEEDLE ASPIRATION N/A 04/29/2021   Procedure: FINE NEEDLE ASPIRATION (FNA) LINEAR;  Surgeon: Milus Banister, MD;  Location: [facility] ENDOSCOPY;  Service: Endoscopy;  Laterality: N/A;   FLEXIBLE SIGMOIDOSCOPY N/A 04/29/2021   Procedure: FLEXIBLE SIGMOIDOSCOPY;  Surgeon: Milus Banister, MD;  Location: [facility] ENDOSCOPY;  Service: Endoscopy;  Laterality: N/A;   FLEXIBLE SIGMOIDOSCOPY N/A 08/13/2021   Procedure: DIAGNOSTIC FLEXIBLE SIGMOIDOSCOPY;  Surgeon: Ileana Roup, MD;  Location: [facility] ORS;  Service: General;  Laterality: N/A;   FRACTURE SURGERY     GIVENS CAPSULE STUDY N/A 10/12/2021   Procedure: GIVENS CAPSULE STUDY;  Surgeon: Jonathon Bellows, MD;  Location: [facility] ENDOSCOPY;  Service: Gastroenterology;  Laterality: N/A;   ORIF FINGER / THUMB FRACTURE     RHINOPLASTY  2008   VASECTOMY      Prior to  Admission medications   Medication Sig Start Date End Date Taking? Authorizing Provider  cetirizine (ZYRTEC) 10 MG tablet Take 10 mg by mouth daily. 12/04/20  Yes [provider]  ferrous sulfate 325 (65 FE) MG tablet Take 325 mg by mouth every other day.   Yes [provider]    Allergies as of 10/20/2022 - Review Complete 10/02/2022  Allergen Reaction Noted   Amoxicillin Hives 12/09/2020   Penicillins Hives and Itching 12/09/2020    Family History  Problem Relation Age of Onset   Hypertension Mother    Allergies Mother    Hypertension Father    Bleeding Disorder Maternal Grandmother    Heart attack Maternal Grandfather    Breast cancer Paternal Grandmother    Clotting disorder Paternal Grandfather     Social History   Socioeconomic History   Marital status: Married    Spouse name: Shogo Spearin   Number of children: 1   Years of education: 16   Highest education level: Bachelor's degree (e.g., BA, AB, BS)  Occupational History   Not on file  Tobacco Use   Smoking status: Never   Smokeless tobacco: Never  Vaping Use   Vaping Use: Never used  Substance and Sexual Activity   Alcohol use: Not Currently   Drug use: Never   Sexual activity: Yes  Other Topics Concern   Not on file  Social History Narrative   Not on file   Social Determinants of Health   Financial Resource Strain: Not on file  Food Insecurity: Not on  file  Transportation Needs: Not on file  Physical Activity: Not on file  Stress: Not on file  Social Connections: Not on file  Intimate Partner Violence: Not on file    Review of Systems: See HPI, otherwise negative ROS  Physical Exam: BP 130/64   Pulse 75   Temp 98.3 F (36.8 C) (Temporal)   Resp 16   Ht 6' (1.829 m)   Wt 88.5 kg   SpO2 100%   BMI 26.45 kg/m  General:   Alert,  pleasant and cooperative in NAD Head:  Normocephalic and atraumatic. Neck:  Supple; no masses or thyromegaly. Lungs:  Clear throughout to  auscultation, normal respiratory effort.    Heart:  +S1, +S2, Regular rate and rhythm, No edema. Abdomen:  Soft, nontender and nondistended. Normal bowel sounds, without guarding, and without rebound.   Neurologic:  Alert and  oriented x4;  grossly normal neurologically.  Impression/Plan: Nathaniel Pratt is here for an colonoscopy to be performed for surveillance due to prior history of rectal mass  Risks, benefits, limitations, and alternatives regarding  colonoscopy have been reviewed with the patient.  Questions have been answered.  All parties agreeable.   Jonathon Bellows, MD  11/02/2022, 11:04 AM

## 2022-11-02 NOTE — Transfer of Care (Signed)
Immediate Anesthesia Transfer of Care Note  Patient: Nathaniel Pratt  Procedure(s) Performed: COLONOSCOPY WITH PROPOFOL  Patient Location: PACU  Anesthesia Type:General  Level of Consciousness: drowsy  Airway & Oxygen Therapy: Patient Spontanous Breathing  Post-op Assessment: Report given to RN and Post -op Vital signs reviewed and stable  Post vital signs: Reviewed and stable  Last Vitals:  Vitals Value Taken Time  BP 103/71 1130  Temp 35.8 1130  Pulse 67 11/02/22 1130  Resp 17 11/02/22 1130  SpO2 100 % 11/02/22 1130  Vitals shown include unvalidated device data.  Last Pain:  Vitals:   11/02/22 1039  TempSrc: Temporal  PainSc: 0-No pain         Complications: No notable events documented.

## 2022-11-02 NOTE — Anesthesia Preprocedure Evaluation (Signed)
Anesthesia Evaluation  Patient identified by MRN, date of birth, ID band Patient awake    Reviewed: Allergy & Precautions, NPO status , Patient's Chart, lab work & pertinent test results  Airway Mallampati: III  TM Distance: >3 FB Neck ROM: full    Dental  (+) Chipped   Pulmonary neg pulmonary ROS   Pulmonary exam normal        Cardiovascular negative cardio ROS Normal cardiovascular exam     Neuro/Psych negative neurological ROS  negative psych ROS   GI/Hepatic negative GI ROS, Neg liver ROS,,,  Endo/Other  negative endocrine ROS    Renal/GU negative Renal ROS  negative genitourinary   Musculoskeletal   Abdominal   Peds  Hematology negative hematology ROS (+)   Anesthesia Other Findings Past Medical History: No date: Allergy No date: Anemia No date: Asthma 2008: Broken nose No date: Family history of breast cancer 02/2009: Thumb fracture  Past Surgical History: 04/07/2021: COLONOSCOPY WITH PROPOFOL; N/A     Comment:  Procedure: COLONOSCOPY WITH PROPOFOL;  Surgeon: Jonathon Bellows, MD;  Location: [facility] ENDOSCOPY;  Service:               Gastroenterology;  Laterality: N/A; 04/07/2021: ESOPHAGOGASTRODUODENOSCOPY (EGD) WITH PROPOFOL; N/A     Comment:  Procedure: ESOPHAGOGASTRODUODENOSCOPY (EGD) WITH               PROPOFOL;  Surgeon: Jonathon Bellows, MD;  Location: [facility]               ENDOSCOPY;  Service: Gastroenterology;  Laterality: N/A; 04/29/2021: EUS; N/A     Comment:  Procedure: LOWER ENDOSCOPIC ULTRASOUND (EUS);  Surgeon:               Milus Banister, MD;  Location: [facility] ENDOSCOPY;  Service:               Endoscopy;  Laterality: N/A; 04/29/2021: FINE NEEDLE ASPIRATION; N/A     Comment:  Procedure: FINE NEEDLE ASPIRATION (FNA) LINEAR;                Surgeon: Milus Banister, MD;  Location: [facility] ENDOSCOPY;                Service: Endoscopy;  Laterality: N/A; 04/29/2021: FLEXIBLE SIGMOIDOSCOPY;  N/A     Comment:  Procedure: FLEXIBLE SIGMOIDOSCOPY;  Surgeon: Milus Banister, MD;  Location: [facility] ENDOSCOPY;  Service:               Endoscopy;  Laterality: N/A; 08/13/2021: FLEXIBLE SIGMOIDOSCOPY; N/A     Comment:  Procedure: DIAGNOSTIC FLEXIBLE SIGMOIDOSCOPY;  Surgeon:               Ileana Roup, MD;  Location: [facility] ORS;  Service:               General;  Laterality: N/A; No date: FRACTURE SURGERY 10/12/2021: GIVENS CAPSULE STUDY; N/A     Comment:  Procedure: GIVENS CAPSULE STUDY;  Surgeon: Jonathon Bellows,               MD;  Location: [facility] ENDOSCOPY;  Service:               Gastroenterology;  Laterality: N/A; No date: ORIF FINGER / THUMB FRACTURE 2008: RHINOPLASTY No date: VASECTOMY  BMI    Body Mass Index: 26.45 kg/m  Reproductive/Obstetrics negative OB ROS                             Anesthesia Physical Anesthesia Plan  ASA: 1  Anesthesia Plan: General   Post-op Pain Management: Minimal or no pain anticipated   Induction: Intravenous  PONV Risk Score and Plan: 3 and Propofol infusion, TIVA and Ondansetron  Airway Management Planned: Nasal Cannula  Additional Equipment: None  Intra-op Plan:   Post-operative Plan:   Informed Consent: I have reviewed the patients History and Physical, chart, labs and discussed the procedure including the risks, benefits and alternatives for the proposed anesthesia with the patient or authorized representative who has indicated his/her understanding and acceptance.     Dental advisory given  Plan Discussed with: CRNA and Surgeon  Anesthesia Plan Comments: (Discussed risks of anesthesia with patient, including possibility of difficulty with spontaneous ventilation under anesthesia necessitating airway intervention, PONV, and rare risks such as cardiac or respiratory or neurological events, and allergic reactions. Discussed the role of CRNA in patient's perioperative care. Patient  understands.)       Anesthesia Quick Evaluation

## 2022-11-03 ENCOUNTER — Telehealth: Payer: Self-pay

## 2022-11-03 ENCOUNTER — Encounter: Payer: Self-pay | Admitting: Gastroenterology

## 2022-11-03 LAB — SURGICAL PATHOLOGY

## 2022-11-03 NOTE — Telephone Encounter (Signed)
Called patient to let him know the below information. I also let him know that I would be putting him in our recall list to remind him that he will need a flexible sigmoidoscopy in 3 years. Patient understood and had no further questions.

## 2022-11-03 NOTE — Telephone Encounter (Signed)
-----   Message from Jonathon Bellows, MD sent at 11/03/2022 11:23 AM EST ----- Inform biopsies taken at [facility] were normal - suggest flexible sigmoidoscopy in 3 years , please place recall

## 2022-11-03 NOTE — Progress Notes (Signed)
Inform biopsies taken at [facility] were normal - suggest flexible sigmoidoscopy in 3 years , please place recall

## 2023-01-02 NOTE — Telephone Encounter (Signed)
Pt's wife called in for triage but encounter was opened in husband's chart. Will close out d/t error.   Summary: jaw swollen   Pt called in states jaw in left  area is swollen , can't come tomorrow at 11am because of work, needs a later time.         This encounter was created in error - please disregard.

## 2023-01-26 ENCOUNTER — Other Ambulatory Visit: Payer: BC Managed Care – PPO

## 2023-01-26 DIAGNOSIS — Z9852 Vasectomy status: Secondary | ICD-10-CM

## 2023-01-27 ENCOUNTER — Telehealth: Payer: Self-pay

## 2023-01-27 LAB — POST-VAS SPERM EVALUATION,QUAL: Volume: 4.8 mL

## 2023-01-27 LAB — TEST CODE CHANGE

## 2023-01-27 NOTE — Telephone Encounter (Signed)
Pt. Called triage line @1528  inquiring about lab results.

## 2023-01-27 NOTE — Telephone Encounter (Signed)
Return call to pt. @1528  regarding lab results.  Pt. Informed of lab results from 01/26/2023 of no sperm noted in semen specimen.  Pt. Verbalized understanding.

## 2023-03-05 ENCOUNTER — Ambulatory Visit
Admission: RE | Admit: 2023-03-05 | Discharge: 2023-03-05 | Disposition: A | Discharge status: Home / Self Care | Payer: BC Managed Care – PPO | Source: Ambulatory Visit | Attending: Emergency Medicine | Admitting: Emergency Medicine

## 2023-03-05 VITALS — BP 109/70 | HR 79 | Temp 98.0°F | Resp 18

## 2023-03-05 DIAGNOSIS — Z23 Encounter for immunization: Secondary | ICD-10-CM | POA: Diagnosis not present

## 2023-03-05 DIAGNOSIS — L03115 Cellulitis of right lower limb: Secondary | ICD-10-CM

## 2023-03-05 DIAGNOSIS — T148XXA Other injury of unspecified body region, initial encounter: Secondary | ICD-10-CM

## 2023-03-05 DIAGNOSIS — L089 Local infection of the skin and subcutaneous tissue, unspecified: Secondary | ICD-10-CM

## 2023-03-05 MED ORDER — DOXYCYCLINE HYCLATE 100 MG PO CAPS: 100.0000 mg | ORAL_CAPSULE | Freq: Two times a day (BID) | ORAL | 0 refills | Status: AC

## 2023-03-05 MED ORDER — TETANUS-DIPHTH-ACELL PERTUSSIS 5-2.5-18.5 LF-MCG/0.5 IM SUSY
0.5000 mL | PREFILLED_SYRINGE | Freq: Once | INTRAMUSCULAR | Status: AC
  Administered 2023-03-05: 0.5 mL via INTRAMUSCULAR

## 2023-03-05 NOTE — Discharge Instructions (Addendum)
Your tetanus was updated today.    Take the doxycycline as directed.  Follow up with your primary care provider in 1-2 days for recheck.    Go to the emergency department if you have worsening symptoms.

## 2023-03-05 NOTE — ED Triage Notes (Addendum)
Patient to Urgent Care with complaints of possible wound infection present below his right knee cap. Reports scrapping his knee approx 1 month ago on composite deck.  Started developing redness/ pain/ purulent drainage four days ago. Worsened yesterday.   Denies any fevers. Taking tylenol.   Unsure of last TDAP, states likely more than 5 years ago.

## 2023-03-05 NOTE — ED Provider Notes (Signed)
Nathaniel Pratt    CSN: [id number] Arrival date & time: 03/05/23  0840      History   Chief Complaint Chief Complaint  Patient presents with   Abrasion    Directly below the right kneecap. Very red, painful, pus. Believe it is infected - Entered by patient    HPI Nathaniel Pratt is a 32 y.o. male.  Patient presents with wound below his right knee that occurred 1 month ago when he accidentally scraped it on deck.  The wound is not healing well and has developed redness, pain, pus x 4 days.   Treating with Tylenol.  No fever, red streaks, or other symptoms.  Last tetanus unknown.   The history is provided by the patient and medical records.    Past Medical History:  Diagnosis Date   Allergy    Anemia    Asthma    Broken nose 2008   Family history of breast cancer    Thumb fracture 02/2009    Patient Active Problem List   Diagnosis Date Noted   Rectal mass 11/02/2022   Adenomatous polyp of colon 11/02/2022   Iron deficiency 04/14/2022   History of benign schwannoma 08/13/2021   S/P laparoscopic-assisted sigmoidectomy 08/13/2021   Genetic testing 06/24/2021   Family history of breast cancer 06/10/2021   Environmental and seasonal allergies 12/09/2020    Past Surgical History:  Procedure Laterality Date   COLONOSCOPY WITH PROPOFOL N/A 04/07/2021   Procedure: COLONOSCOPY WITH PROPOFOL;  Surgeon: Wyline Mood, MD;  Location: [facility] ENDOSCOPY;  Service: Gastroenterology;  Laterality: N/A;   COLONOSCOPY WITH PROPOFOL N/A 11/02/2022   Procedure: COLONOSCOPY WITH PROPOFOL;  Surgeon: Wyline Mood, MD;  Location: [facility] ENDOSCOPY;  Service: Endoscopy;  Laterality: N/A;   ESOPHAGOGASTRODUODENOSCOPY (EGD) WITH PROPOFOL N/A 04/07/2021   Procedure: ESOPHAGOGASTRODUODENOSCOPY (EGD) WITH PROPOFOL;  Surgeon: Wyline Mood, MD;  Location: [facility] ENDOSCOPY;  Service: Gastroenterology;  Laterality: N/A;   EUS N/A 04/29/2021   Procedure: LOWER ENDOSCOPIC ULTRASOUND (EUS);  Surgeon: Rachael Fee, MD;  Location: [facility] ENDOSCOPY;  Service: Endoscopy;  Laterality: N/A;   FINE NEEDLE ASPIRATION N/A 04/29/2021   Procedure: FINE NEEDLE ASPIRATION (FNA) LINEAR;  Surgeon: Rachael Fee, MD;  Location: [facility] ENDOSCOPY;  Service: Endoscopy;  Laterality: N/A;   FLEXIBLE SIGMOIDOSCOPY N/A 04/29/2021   Procedure: FLEXIBLE SIGMOIDOSCOPY;  Surgeon: Rachael Fee, MD;  Location: [facility] ENDOSCOPY;  Service: Endoscopy;  Laterality: N/A;   FLEXIBLE SIGMOIDOSCOPY N/A 08/13/2021   Procedure: DIAGNOSTIC FLEXIBLE SIGMOIDOSCOPY;  Surgeon: Andria Meuse, MD;  Location: [facility] ORS;  Service: General;  Laterality: N/A;   FRACTURE SURGERY     GIVENS CAPSULE STUDY N/A 10/12/2021   Procedure: GIVENS CAPSULE STUDY;  Surgeon: Wyline Mood, MD;  Location: [facility] ENDOSCOPY;  Service: Gastroenterology;  Laterality: N/A;   ORIF FINGER / THUMB FRACTURE     RHINOPLASTY  2008   VASECTOMY         Home Medications    Prior to Admission medications   Medication Sig Start Date End Date Taking? Authorizing Provider  doxycycline (VIBRAMYCIN) 100 MG capsule Take 1 capsule (100 mg total) by mouth 2 (two) times daily for 7 days. 03/05/23 03/12/23 Yes Mickie Bail, NP  cetirizine (ZYRTEC) 10 MG tablet Take 10 mg by mouth daily. 12/04/20   [provider]  ferrous sulfate 325 (65 FE) MG tablet Take 325 mg by mouth every other day.    [provider]    Family History Family History  Problem  Relation Age of Onset   Hypertension Mother    Allergies Mother    Hypertension Father    Bleeding Disorder Maternal Grandmother    Heart attack Maternal Grandfather    Breast cancer Paternal Grandmother    Clotting disorder Paternal Grandfather     Social History Social History   Tobacco Use   Smoking status: Never   Smokeless tobacco: Never  Vaping Use   Vaping Use: Never used  Substance Use Topics   Alcohol use: Not Currently   Drug use: Never     Allergies   Amoxicillin and  Penicillins   Review of Systems Review of Systems  Constitutional:  Negative for chills and fever.  Musculoskeletal:  Positive for joint swelling. Negative for arthralgias.  Skin:  Positive for color change and wound.  Neurological:  Negative for weakness and numbness.     Physical Exam Triage Vital Signs ED Triage Vitals  Enc Vitals Group     BP      Pulse      Resp      Temp      Temp src      SpO2      Weight      Height      Head Circumference      Peak Flow      Pain Score      Pain Loc      Pain Edu?      Excl. in GC?    No data found.  Updated Vital Signs BP 109/70   Pulse 79   Temp 98 F (36.7 C)   Resp 18   SpO2 96%   Visual Acuity Right Eye Distance:   Left Eye Distance:   Bilateral Distance:    Right Eye Near:   Left Eye Near:    Bilateral Near:     Physical Exam Vitals and nursing note reviewed.  Constitutional:      General: He is not in acute distress.    Appearance: He is well-developed.  HENT:     Mouth/Throat:     Mouth: Mucous membranes are moist.  Cardiovascular:     Rate and Rhythm: Normal rate and regular rhythm.     Heart sounds: Normal heart sounds.  Pulmonary:     Effort: Pulmonary effort is normal. No respiratory distress.     Breath sounds: Normal breath sounds.  Musculoskeletal:        General: No swelling or deformity. Normal range of motion.     Cervical back: Neck supple.  Skin:    General: Skin is warm and dry.     Capillary Refill: Capillary refill takes less than 2 seconds.     Findings: Erythema and lesion present.     Comments: Wound on right leg below knee with erythema and edema.  See pictures.   Neurological:     General: No focal deficit present.     Mental Status: He is alert and oriented to person, place, and time.     Sensory: No sensory deficit.     Motor: No weakness.     Gait: Gait normal.  Psychiatric:        Mood and Affect: Mood normal.        Behavior: Behavior normal.            UC Treatments / Results  Labs (all labs ordered are listed, but only abnormal results are displayed) Labs Reviewed - No data to display  EKG  Radiology No results found.  Procedures Procedures (including critical care time)  Medications Ordered in UC Medications  Tdap (BOOSTRIX) injection 0.5 mL (0.5 mLs Intramuscular Given 03/05/23 0854)    Initial Impression / Assessment and Plan / UC Course  I have reviewed the triage vital signs and the nursing notes.  Pertinent labs & imaging results that were available during my care of the patient were reviewed by me and considered in my medical decision making (see chart for details).    Cellulitis and infected wound of right leg.  Tetanus updated today.  Treating with doxycycline.  Wound care instructions discussed.  Instructed patient to schedule a follow-up appointment with his PCP for recheck in 1 to 2 days.  ED precautions given.  Education provided on cellulitis and wound care.  He agrees to plan of care.  Final Clinical Impressions(s) / UC Diagnoses   Final diagnoses:  Cellulitis of leg, right  Infected wound     Discharge Instructions      Your tetanus was updated today.    Take the doxycycline as directed.  Follow up with your primary care provider in 1-2 days for recheck.    Go to the emergency department if you have worsening symptoms.         ED Prescriptions     Medication Sig Dispense Auth. Provider   doxycycline (VIBRAMYCIN) 100 MG capsule Take 1 capsule (100 mg total) by mouth 2 (two) times daily for 7 days. 14 capsule Mickie Bail, NP      PDMP not reviewed this encounter.   Mickie Bail, NP 03/05/23 [phone]

## 2023-03-06 ENCOUNTER — Ambulatory Visit: Payer: BC Managed Care – PPO | Admitting: Internal Medicine

## 2023-03-06 ENCOUNTER — Ambulatory Visit (INDEPENDENT_AMBULATORY_CARE_PROVIDER_SITE_OTHER): Payer: BC Managed Care – PPO | Admitting: Internal Medicine

## 2023-03-06 VITALS — BP 124/60 | HR 81 | Ht 72.0 in | Wt 202.0 lb

## 2023-03-06 DIAGNOSIS — L03115 Cellulitis of right lower limb: Secondary | ICD-10-CM | POA: Diagnosis not present

## 2023-03-06 NOTE — Progress Notes (Signed)
Date:  03/06/2023   Name:  Nathaniel Pratt   DOB:  June 15, 1991   MRN:  [id number]   Chief Complaint: Wound Check Larey Seat on his deck 1 month ago. Wednesday last week patient noticed redness and swelling. Went to UC yesterday. They took photos and prescribed Doxycycline, and TDAP shot. )  Wound Check He was originally treated more than 14 days ago (fell onto a composite deck - intially dirty but no concern for foreign body). Previous treatment included wound cleansing or irrigation (started Doxycycline yesterday). Maximum temperature: no fever. There has been bloody (and purulent material this AM) discharge from the wound. The swelling has not changed. The pain has improved.    Lab Results  Component Value Date   NA 141 06/21/2022   K 4.7 06/21/2022   CO2 25 06/21/2022   GLUCOSE 86 06/21/2022   BUN 19 06/21/2022   CREATININE 1.02 06/21/2022   CALCIUM 9.3 06/21/2022   EGFR 101 06/21/2022   GFRNONAA >60 03/10/2022   Lab Results  Component Value Date   CHOL 128 06/21/2022   HDL 44 06/21/2022   LDLCALC 71 06/21/2022   TRIG 61 06/21/2022   CHOLHDL 2.9 06/21/2022   No results found for: "TSH" Lab Results  Component Value Date   HGBA1C 5.5 06/21/2022   Lab Results  Component Value Date   WBC 5.0 06/21/2022   HGB 14.5 06/21/2022   HCT 44.2 06/21/2022   MCV 87 06/21/2022   PLT 225 06/21/2022   Lab Results  Component Value Date   ALT 13 06/21/2022   AST 18 06/21/2022   ALKPHOS 62 06/21/2022   BILITOT 0.3 06/21/2022   No results found for: "25OHVITD2", "25OHVITD3", "VD25OH"   Review of Systems  Constitutional:  Negative for chills and fever.  Respiratory:  Negative for chest tightness and shortness of breath.   Cardiovascular:  Negative for chest pain.  Skin:  Positive for wound.  Psychiatric/Behavioral:  Negative for sleep disturbance. The patient is not nervous/anxious.     Patient Active Problem List   Diagnosis Date Noted   Rectal mass 11/02/2022   Adenomatous  polyp of colon 11/02/2022   Iron deficiency 04/14/2022   History of benign schwannoma 08/13/2021   S/P laparoscopic-assisted sigmoidectomy 08/13/2021   Genetic testing 06/24/2021   Family history of breast cancer 06/10/2021   Environmental and seasonal allergies 12/09/2020    Allergies  Allergen Reactions   Amoxicillin Hives   Penicillins Hives and Itching    Reaction: 18 years    Past Surgical History:  Procedure Laterality Date   COLONOSCOPY WITH PROPOFOL N/A 04/07/2021   Procedure: COLONOSCOPY WITH PROPOFOL;  Surgeon: Wyline Mood, MD;  Location: [facility] ENDOSCOPY;  Service: Gastroenterology;  Laterality: N/A;   COLONOSCOPY WITH PROPOFOL N/A 11/02/2022   Procedure: COLONOSCOPY WITH PROPOFOL;  Surgeon: Wyline Mood, MD;  Location: [facility] ENDOSCOPY;  Service: Endoscopy;  Laterality: N/A;   ESOPHAGOGASTRODUODENOSCOPY (EGD) WITH PROPOFOL N/A 04/07/2021   Procedure: ESOPHAGOGASTRODUODENOSCOPY (EGD) WITH PROPOFOL;  Surgeon: Wyline Mood, MD;  Location: [facility] ENDOSCOPY;  Service: Gastroenterology;  Laterality: N/A;   EUS N/A 04/29/2021   Procedure: LOWER ENDOSCOPIC ULTRASOUND (EUS);  Surgeon: Rachael Fee, MD;  Location: [facility] ENDOSCOPY;  Service: Endoscopy;  Laterality: N/A;   FINE NEEDLE ASPIRATION N/A 04/29/2021   Procedure: FINE NEEDLE ASPIRATION (FNA) LINEAR;  Surgeon: Rachael Fee, MD;  Location: [facility] ENDOSCOPY;  Service: Endoscopy;  Laterality: N/A;   FLEXIBLE SIGMOIDOSCOPY N/A 04/29/2021   Procedure: FLEXIBLE SIGMOIDOSCOPY;  Surgeon: Christella Hartigan,  Melton Alar, MD;  Location: [facility] ENDOSCOPY;  Service: Endoscopy;  Laterality: N/A;   FLEXIBLE SIGMOIDOSCOPY N/A 08/13/2021   Procedure: DIAGNOSTIC FLEXIBLE SIGMOIDOSCOPY;  Surgeon: Andria Meuse, MD;  Location: [facility] ORS;  Service: General;  Laterality: N/A;   FRACTURE SURGERY     GIVENS CAPSULE STUDY N/A 10/12/2021   Procedure: GIVENS CAPSULE STUDY;  Surgeon: Wyline Mood, MD;  Location: [facility] ENDOSCOPY;  Service: Gastroenterology;  Laterality: N/A;    ORIF FINGER / THUMB FRACTURE     RHINOPLASTY  2008   VASECTOMY      Social History   Tobacco Use   Smoking status: Never   Smokeless tobacco: Never  Vaping Use   Vaping Use: Never used  Substance Use Topics   Alcohol use: Not Currently   Drug use: Never     Medication list has been reviewed and updated.  Current Meds  Medication Sig   acetaminophen (TYLENOL) 650 MG CR tablet Take 650 mg by mouth every 8 (eight) hours as needed for pain.   cetirizine (ZYRTEC) 10 MG tablet Take 10 mg by mouth daily.   doxycycline (VIBRAMYCIN) 100 MG capsule Take 1 capsule (100 mg total) by mouth 2 (two) times daily for 7 days.   ferrous sulfate 325 (65 FE) MG tablet Take 325 mg by mouth every other day.   ibuprofen (ADVIL) 200 MG tablet Take 200 mg by mouth every 6 (six) hours as needed.       03/06/2023    2:19 PM 06/21/2022   10:56 AM 12/09/2020    3:18 PM  GAD 7 : Generalized Anxiety Score  Nervous, Anxious, on Edge 1 0 0  Control/stop worrying 1 0 0  Worry too much - different things 1 0 0  Trouble relaxing 1 0 0  Restless 0 0 0  Easily annoyed or irritable 2 0 0  Afraid - awful might happen 0 0 0  Total GAD 7 Score 6 0 0  Anxiety Difficulty Somewhat difficult Not difficult at all        03/06/2023    2:19 PM 06/21/2022   10:56 AM 12/09/2020    3:17 PM  Depression screen PHQ 2/9  Decreased Interest 1 0 0  Down, Depressed, Hopeless 1 0 0  PHQ - 2 Score 2 0 0  Altered sleeping 0 0 0  Tired, decreased energy 0 1 0  Change in appetite 0 0 0  Feeling bad or failure about yourself  1 1 0  Trouble concentrating 0 0 0  Moving slowly or fidgety/restless 0 0 0  Suicidal thoughts 0 0 0  PHQ-9 Score 3 2 0  Difficult doing work/chores Not difficult at all Not difficult at all     BP Readings from Last 3 Encounters:  03/06/23 124/60  03/05/23 109/70  11/02/22 113/82    Physical Exam Vitals and nursing note reviewed.  Constitutional:      General: He is not in acute  distress.    Appearance: He is well-developed.  HENT:     Head: Normocephalic and atraumatic.  Cardiovascular:     Rate and Rhythm: Normal rate and regular rhythm.  Pulmonary:     Effort: Pulmonary effort is normal. No respiratory distress.     Breath sounds: No wheezing or rhonchi.  Musculoskeletal:     Right lower leg: No edema.     Left lower leg: No edema.  Skin:    General: Skin is warm and dry.     Findings: Abrasion  present. No rash.          Comments: Redness with central 1 cm small eschar and minimal bloody drainage - mildly tender and fluctuant.  Neurological:     Mental Status: He is alert and oriented to person, place, and time.  Psychiatric:        Mood and Affect: Mood normal.        Behavior: Behavior normal.     Wt Readings from Last 3 Encounters:  03/06/23 202 lb (91.6 kg)  11/02/22 195 lb (88.5 kg)  09/07/22 200 lb 4 oz (90.8 kg)    BP 124/60   Pulse 81   Ht 6' (1.829 m)   Wt 202 lb (91.6 kg)   SpO2 99%   BMI 27.40 kg/m   Assessment and Plan:  Problem List Items Addressed This Visit   None Visit Diagnoses     Cellulitis of right lower extremity    -  Primary   continue local care finish Doxycycline - call for additional 5 day if needed       No follow-ups on file.   Partially dictated using Dragon software, any errors are not intentional.  Reubin Milan, MD [facility] [place], [place]

## 2023-05-14 IMAGING — CT CT ABD-PELV W/ CM
2 of 4 series · 15 of 46 positions shown, 17 images · IV contrast (omnipaque)
Comparison: None.

CLINICAL DATA: Melena.  Mass found in rectosigmoid colon.

EXAM:
CT ABDOMEN AND PELVIS WITH CONTRAST
TECHNIQUE: Multidetector CT imaging of the abdomen and pelvis was performed
using the standard protocol following bolus administration of
intravenous contrast.
CONTRAST:  100mL OMNIPAQUE IOHEXOL 350 MG/ML SOLN

[Series 2: abd pelvis 5.00 · axial · 0.63mm/px · z∈[-1565,-1130]mm · 12 of 95 slices shown, 14 images]
[im 4/95  soft-tissue]
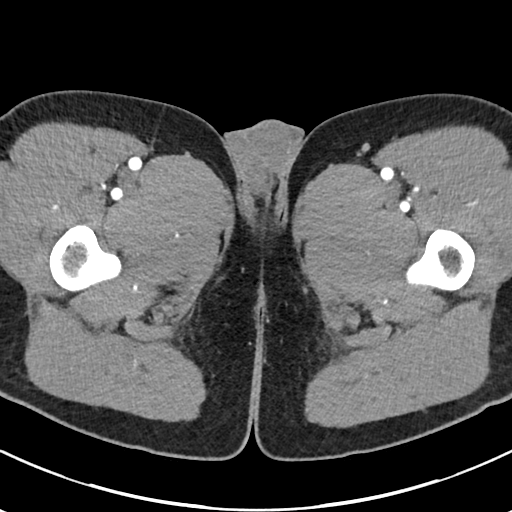
[im 4/95  bone]
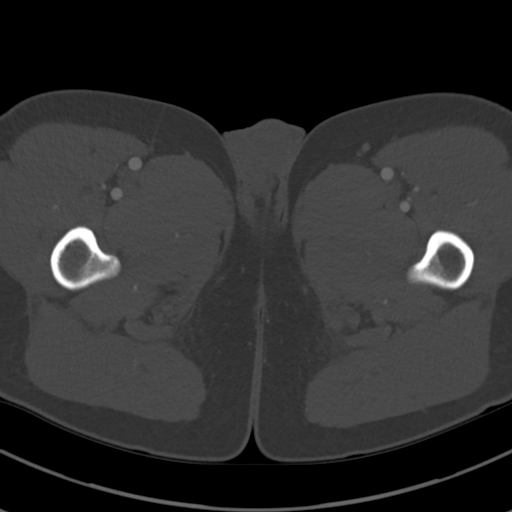
[im 12/95  soft-tissue]
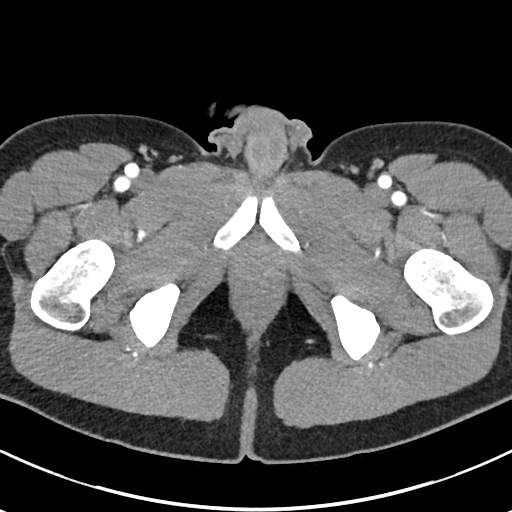
[im 20/95  soft-tissue]
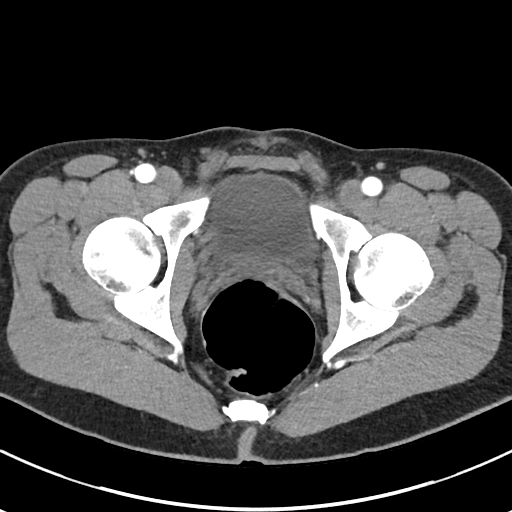
[im 28/95  soft-tissue]
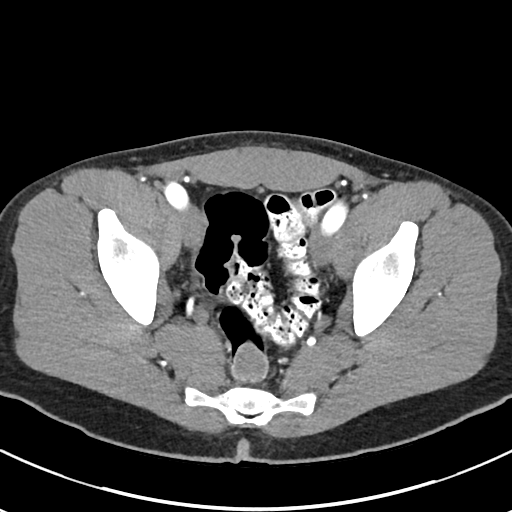
[im 36/95  soft-tissue]
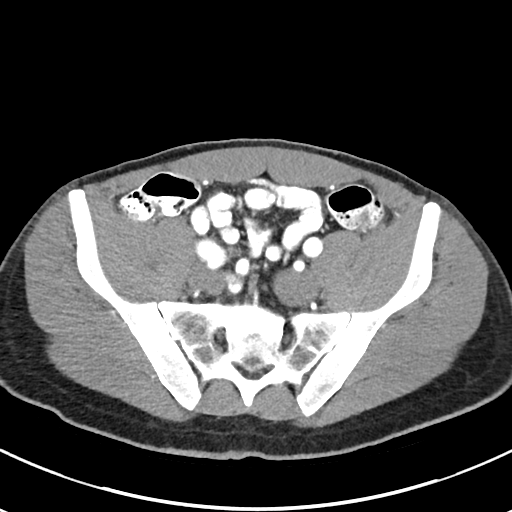
[im 44/95  soft-tissue]
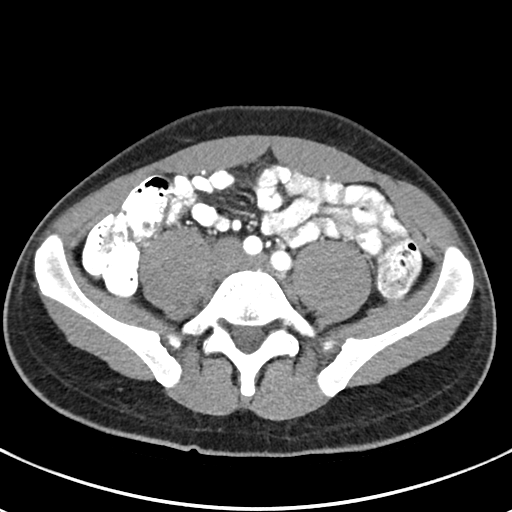
[im 51/95  soft-tissue]
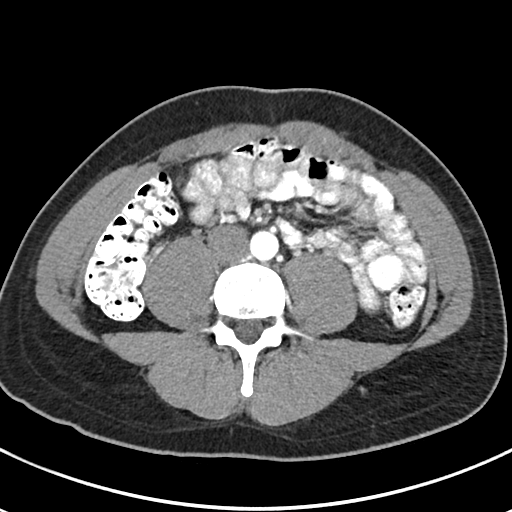
[im 59/95  soft-tissue]
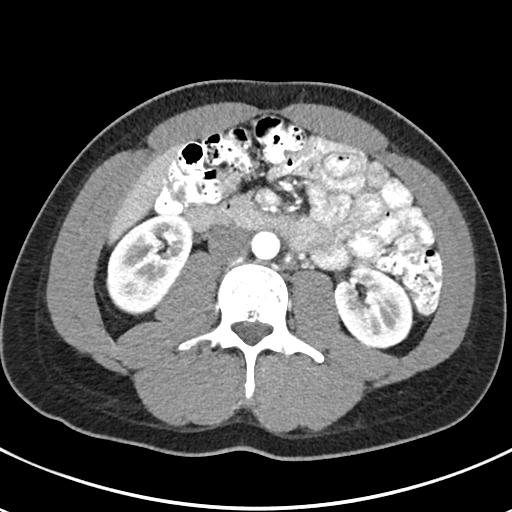
[im 67/95  soft-tissue]
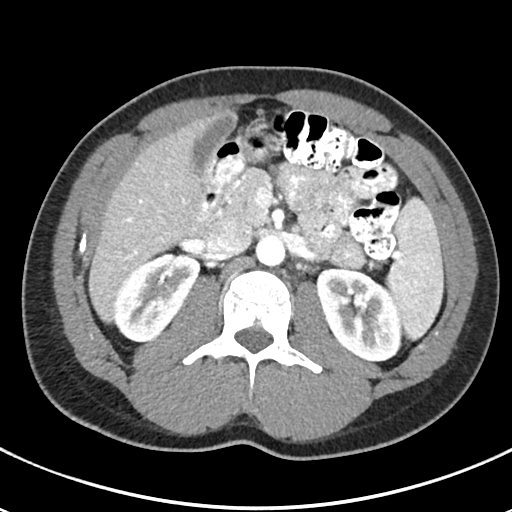
[im 67/95  bone]
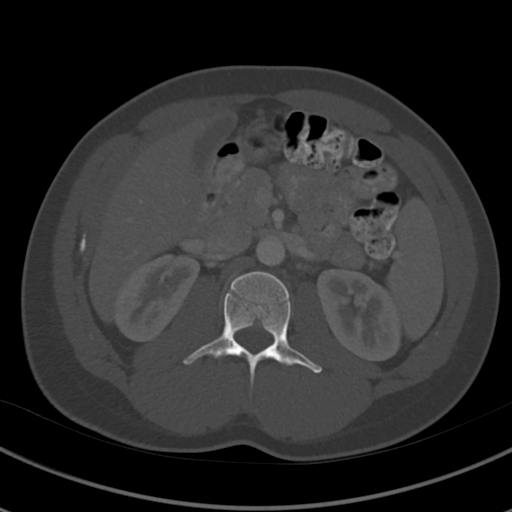
[im 75/95  soft-tissue]
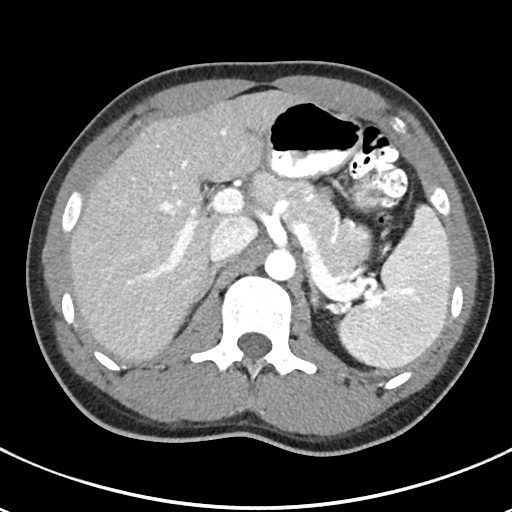
[im 83/95  soft-tissue]
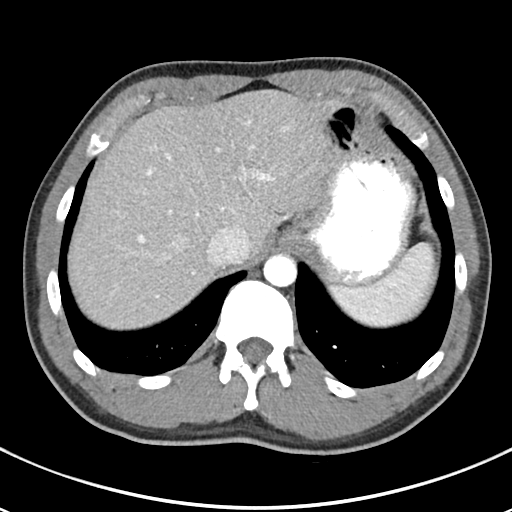
[im 91/95  soft-tissue]
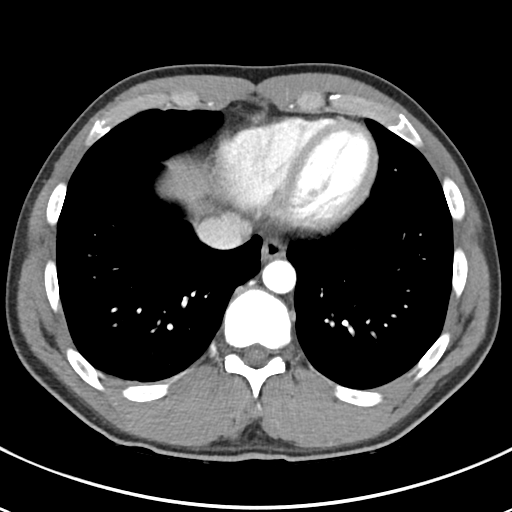

[Series 4: coronals abd pelvis 2.00 cor · coronal · 0.63mm/px · 3 of 132 slices shown]
[im 44/132  soft-tissue]
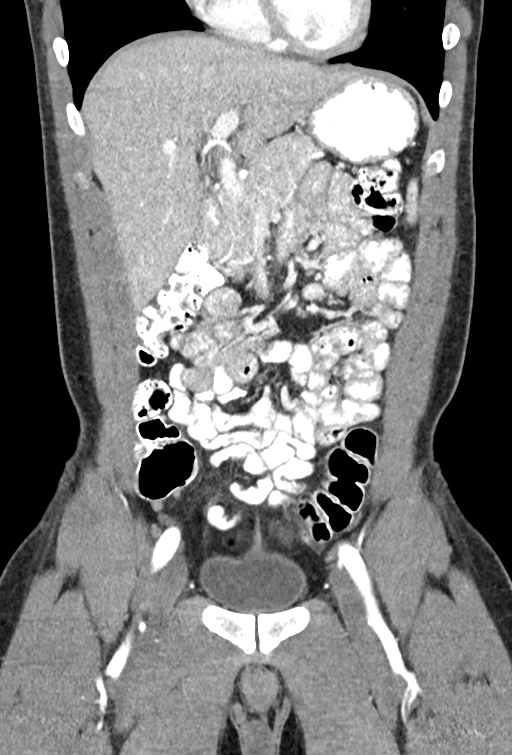
[im 59/132  soft-tissue]
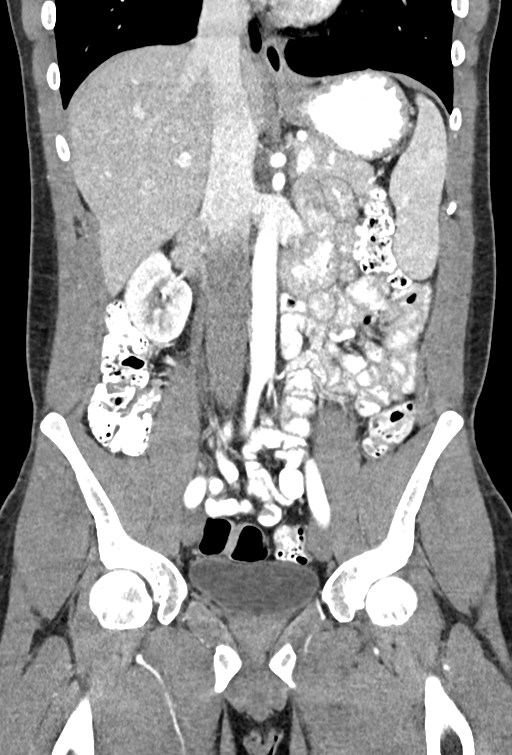
[im 73/132  soft-tissue]
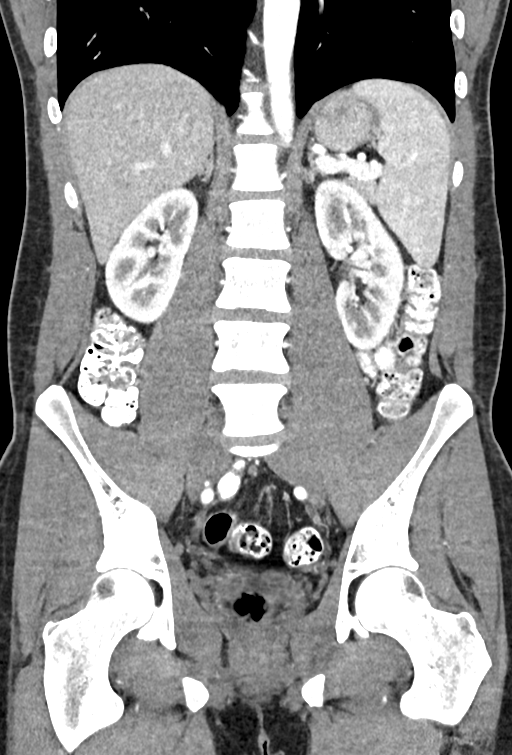

[15 of 46 positions shown; findings below may reference images not displayed]

FINDINGS: Lower chest: Lung bases are clear.

Hepatobiliary: Normal appearance of the liver, gallbladder and
portal venous system. No biliary dilatation.

Pancreas: Unremarkable. No pancreatic ductal dilatation or
surrounding inflammatory changes.

Spleen: Normal in size without focal abnormality.

Adrenals/Urinary Tract: Normal adrenal glands. Normal appearance of
both kidneys without hydronephrosis. No suspicious renal lesions.
Normal appearance of the urinary bladder.

Stomach/Bowel: Well-circumscribed low-density lesion in the proximal
rectum region near the rectosigmoid junction that measures 2.4 x
x 2.1 cm. This appears to be a broad-based lesion and corresponds
with the recent colonoscopy findings. Hounsfield units measure 62
and not compatible with a lipoma. Oral contrast in the small and
large bowel without evidence of obstruction. Normal appearance of
stomach. No acute bowel inflammation.

Vascular/Lymphatic: No significant vascular findings are present. No
enlarged abdominal or pelvic lymph nodes.

Reproductive: Prostate is unremarkable.

Other: Negative for free fluid.  Negative for free air.

Musculoskeletal: No suspicious osseous findings.
IMPRESSION: 1. 2.4 cm rectal neoplasm that is not characteristic for a lipoma.
This rectal lesion is nonspecific on CT imaging and recommend tissue
sampling.
2. No suspicious lymphadenopathy. No evidence for metastatic
disease.

## 2023-06-26 NOTE — Assessment & Plan Note (Signed)
Noted low iron in 2023 attributed to blood donation Currently on iron every other day Will check labs and advise if he can stop iron

## 2023-06-26 NOTE — Progress Notes (Unsigned)
Date:  06/27/2023   Name:  Nathaniel Pratt   DOB:  1991-06-16   MRN:  [id number]   Chief Complaint: No chief complaint on file. Nathaniel Pratt is a 32 y.o. male who presents today for his Complete Annual Exam. He feels {DESC; WELL/FAIRLY WELL/POORLY:18703}. He reports exercising ***. He reports he is sleeping {DESC; WELL/FAIRLY WELL/POORLY:18703}.   Colonoscopy: 10/2022 for rectal mass - inflammatory polyp no dysplasia or malignancy  Immunization History  Administered Date(s) Administered   PFIZER(Purple Top)SARS-COV-2 Vaccination 11/16/2019, 12/10/2019   Tdap 03/05/2023   Health Maintenance Due  Topic Date Due   HIV Screening  Never done   COVID-19 Vaccine (3 - Pfizer risk series) 01/07/2020   INFLUENZA VACCINE  Never done    No results found for: "PSA1", "PSA"  HPI  Review of Systems   Lab Results  Component Value Date   NA 141 06/21/2022   K 4.7 06/21/2022   CO2 25 06/21/2022   GLUCOSE 86 06/21/2022   BUN 19 06/21/2022   CREATININE 1.02 06/21/2022   CALCIUM 9.3 06/21/2022   EGFR 101 06/21/2022   GFRNONAA >60 03/10/2022   Lab Results  Component Value Date   CHOL 128 06/21/2022   HDL 44 06/21/2022   LDLCALC 71 06/21/2022   TRIG 61 06/21/2022   CHOLHDL 2.9 06/21/2022   No results found for: "TSH" Lab Results  Component Value Date   HGBA1C 5.5 06/21/2022   Lab Results  Component Value Date   WBC 5.0 06/21/2022   HGB 14.5 06/21/2022   HCT 44.2 06/21/2022   MCV 87 06/21/2022   PLT 225 06/21/2022   Lab Results  Component Value Date   ALT 13 06/21/2022   AST 18 06/21/2022   ALKPHOS 62 06/21/2022   BILITOT 0.3 06/21/2022   No results found for: "25OHVITD2", "25OHVITD3", "VD25OH"   Patient Active Problem List   Diagnosis Date Noted   Rectal mass 11/02/2022   Adenomatous polyp of colon 11/02/2022   Iron deficiency 04/14/2022   History of benign schwannoma 08/13/2021   S/P laparoscopic-assisted sigmoidectomy 08/13/2021   Genetic testing  06/24/2021   Family history of breast cancer 06/10/2021   Environmental and seasonal allergies 12/09/2020    Allergies  Allergen Reactions   Amoxicillin Hives   Penicillins Hives and Itching    Reaction: 18 years    Past Surgical History:  Procedure Laterality Date   COLONOSCOPY WITH PROPOFOL N/A 04/07/2021   Procedure: COLONOSCOPY WITH PROPOFOL;  Surgeon: Wyline Mood, MD;  Location: [facility] ENDOSCOPY;  Service: Gastroenterology;  Laterality: N/A;   COLONOSCOPY WITH PROPOFOL N/A 11/02/2022   Procedure: COLONOSCOPY WITH PROPOFOL;  Surgeon: Wyline Mood, MD;  Location: [facility] ENDOSCOPY;  Service: Endoscopy;  Laterality: N/A;   ESOPHAGOGASTRODUODENOSCOPY (EGD) WITH PROPOFOL N/A 04/07/2021   Procedure: ESOPHAGOGASTRODUODENOSCOPY (EGD) WITH PROPOFOL;  Surgeon: Wyline Mood, MD;  Location: [facility] ENDOSCOPY;  Service: Gastroenterology;  Laterality: N/A;   EUS N/A 04/29/2021   Procedure: LOWER ENDOSCOPIC ULTRASOUND (EUS);  Surgeon: Rachael Fee, MD;  Location: [facility] ENDOSCOPY;  Service: Endoscopy;  Laterality: N/A;   FINE NEEDLE ASPIRATION N/A 04/29/2021   Procedure: FINE NEEDLE ASPIRATION (FNA) LINEAR;  Surgeon: Rachael Fee, MD;  Location: [facility] ENDOSCOPY;  Service: Endoscopy;  Laterality: N/A;   FLEXIBLE SIGMOIDOSCOPY N/A 04/29/2021   Procedure: FLEXIBLE SIGMOIDOSCOPY;  Surgeon: Rachael Fee, MD;  Location: [facility] ENDOSCOPY;  Service: Endoscopy;  Laterality: N/A;   FLEXIBLE SIGMOIDOSCOPY N/A 08/13/2021   Procedure: DIAGNOSTIC FLEXIBLE SIGMOIDOSCOPY;  Surgeon: Cliffton Asters,  Stephanie Coup, MD;  Location: [facility] ORS;  Service: General;  Laterality: N/A;   FRACTURE SURGERY     GIVENS CAPSULE STUDY N/A 10/12/2021   Procedure: GIVENS CAPSULE STUDY;  Surgeon: Wyline Mood, MD;  Location: [facility] ENDOSCOPY;  Service: Gastroenterology;  Laterality: N/A;   ORIF FINGER / THUMB FRACTURE     RHINOPLASTY  2008   VASECTOMY      Social History   Tobacco Use   Smoking status: Never   Smokeless tobacco: Never  Vaping Use    Vaping status: Never Used  Substance Use Topics   Alcohol use: Not Currently   Drug use: Never     Medication list has been reviewed and updated.  No outpatient medications have been marked as taking for the 06/27/23 encounter (Appointment) with Reubin Milan, MD.       03/06/2023    2:19 PM 06/21/2022   10:56 AM 12/09/2020    3:18 PM  GAD 7 : Generalized Anxiety Score  Nervous, Anxious, on Edge 1 0 0  Control/stop worrying 1 0 0  Worry too much - different things 1 0 0  Trouble relaxing 1 0 0  Restless 0 0 0  Easily annoyed or irritable 2 0 0  Afraid - awful might happen 0 0 0  Total GAD 7 Score 6 0 0  Anxiety Difficulty Somewhat difficult Not difficult at all        03/06/2023    2:19 PM 06/21/2022   10:56 AM 12/09/2020    3:17 PM  Depression screen PHQ 2/9  Decreased Interest 1 0 0  Down, Depressed, Hopeless 1 0 0  PHQ - 2 Score 2 0 0  Altered sleeping 0 0 0  Tired, decreased energy 0 1 0  Change in appetite 0 0 0  Feeling bad or failure about yourself  1 1 0  Trouble concentrating 0 0 0  Moving slowly or fidgety/restless 0 0 0  Suicidal thoughts 0 0 0  PHQ-9 Score 3 2 0  Difficult doing work/chores Not difficult at all Not difficult at all     BP Readings from Last 3 Encounters:  03/06/23 124/60  03/05/23 109/70  11/02/22 113/82    Physical Exam  Wt Readings from Last 3 Encounters:  03/06/23 202 lb (91.6 kg)  11/02/22 195 lb (88.5 kg)  09/07/22 200 lb 4 oz (90.8 kg)    There were no vitals taken for this visit.  Assessment and Plan:  Problem List Items Addressed This Visit       Unprioritized   Iron deficiency (Chronic)    Noted low iron in 2023 attributed to blood donation      Other Visit Diagnoses     Annual physical exam    -  Primary   Colon cancer screening       Screening for lipid disorders       Screening for diabetes mellitus           No follow-ups on file.    Reubin Milan, MD [facility] [place]

## 2023-06-27 ENCOUNTER — Ambulatory Visit (INDEPENDENT_AMBULATORY_CARE_PROVIDER_SITE_OTHER): Payer: BC Managed Care – PPO | Admitting: Internal Medicine

## 2023-06-27 ENCOUNTER — Encounter: Payer: Self-pay | Admitting: Internal Medicine

## 2023-06-27 VITALS — BP 124/70 | HR 67 | Ht 72.0 in | Wt 200.0 lb

## 2023-06-27 DIAGNOSIS — Z0000 Encounter for general adult medical examination without abnormal findings: Secondary | ICD-10-CM | POA: Diagnosis not present

## 2023-06-27 DIAGNOSIS — Z13220 Encounter for screening for lipoid disorders: Secondary | ICD-10-CM

## 2023-06-27 DIAGNOSIS — E611 Iron deficiency: Secondary | ICD-10-CM

## 2023-06-27 DIAGNOSIS — Z131 Encounter for screening for diabetes mellitus: Secondary | ICD-10-CM | POA: Diagnosis not present

## 2023-06-27 DIAGNOSIS — Z86018 Personal history of other benign neoplasm: Secondary | ICD-10-CM

## 2023-06-27 DIAGNOSIS — Z1211 Encounter for screening for malignant neoplasm of colon: Secondary | ICD-10-CM

## 2023-06-27 NOTE — Assessment & Plan Note (Signed)
Had repeat colonoscopy in 09/2022 - one polyp Recommend repeat Sigmoidoscopy in 3 yrs

## 2023-06-28 ENCOUNTER — Other Ambulatory Visit: Payer: Self-pay | Admitting: Internal Medicine

## 2023-06-28 DIAGNOSIS — E611 Iron deficiency: Secondary | ICD-10-CM

## 2023-06-28 LAB — CBC WITH DIFFERENTIAL/PLATELET
Basophils Absolute: 0 10*3/uL (ref 0.0–0.2)
Basos: 1 %
EOS (ABSOLUTE): 0.2 10*3/uL (ref 0.0–0.4)
Eos: 4 %
Hematocrit: 45.6 % (ref 37.5–51.0)
Hemoglobin: 15.1 g/dL (ref 13.0–17.7)
Immature Grans (Abs): 0 10*3/uL (ref 0.0–0.1)
Immature Granulocytes: 0 %
Lymphocytes Absolute: 1.4 10*3/uL (ref 0.7–3.1)
Lymphs: 35 %
MCH: 31 pg (ref 26.6–33.0)
MCHC: 33.1 g/dL (ref 31.5–35.7)
MCV: 94 fL (ref 79–97)
Monocytes Absolute: 0.5 10*3/uL (ref 0.1–0.9)
Monocytes: 11 %
Neutrophils Absolute: 2.1 10*3/uL (ref 1.4–7.0)
Neutrophils: 49 %
Platelets: 196 10*3/uL (ref 150–450)
RBC: 4.87 x10E6/uL (ref 4.14–5.80)
RDW: 11.8 % (ref 11.6–15.4)
WBC: 4.1 10*3/uL (ref 3.4–10.8)

## 2023-06-28 LAB — IRON,TIBC AND FERRITIN PANEL
Ferritin: 55 ng/mL (ref 30–400)
Iron Saturation: 37 % (ref 15–55)
Iron: 116 ug/dL (ref 38–169)
Total Iron Binding Capacity: 312 ug/dL (ref 250–450)
UIBC: 196 ug/dL (ref 111–343)

## 2023-06-28 LAB — COMPREHENSIVE METABOLIC PANEL
ALT: 19 [IU]/L (ref 0–44)
AST: 22 [IU]/L (ref 0–40)
Albumin: 4.4 g/dL (ref 4.1–5.1)
Alkaline Phosphatase: 61 [IU]/L (ref 44–121)
BUN/Creatinine Ratio: 18 (ref 9–20)
BUN: 19 mg/dL (ref 6–20)
Bilirubin Total: 0.4 mg/dL (ref 0.0–1.2)
CO2: 25 mmol/L (ref 20–29)
Calcium: 9.6 mg/dL (ref 8.7–10.2)
Chloride: 102 mmol/L (ref 96–106)
Creatinine, Ser: 1.06 mg/dL (ref 0.76–1.27)
Globulin, Total: 2.6 g/dL (ref 1.5–4.5)
Glucose: 92 mg/dL (ref 70–99)
Potassium: 5 mmol/L (ref 3.5–5.2)
Sodium: 139 mmol/L (ref 134–144)
Total Protein: 7 g/dL (ref 6.0–8.5)
eGFR: 96 mL/min/{1.73_m2} (ref >59)

## 2023-06-28 LAB — LIPID PANEL
Chol/HDL Ratio: 2.8 ratio (ref 0.0–5.0)
Cholesterol, Total: 128 mg/dL (ref 100–199)
HDL: 45 mg/dL (ref >39)
LDL Chol Calc (NIH): 68 mg/dL (ref 0–99)
Triglycerides: 74 mg/dL (ref 0–149)
VLDL Cholesterol Cal: 15 mg/dL (ref 5–40)

## 2023-06-28 LAB — HEMOGLOBIN A1C
Est. average glucose Bld gHb Est-mCnc: 103 mg/dL
Hgb A1c MFr Bld: 5.2 % (ref 4.8–5.6)

## 2023-09-27 ENCOUNTER — Other Ambulatory Visit: Payer: Self-pay

## 2023-09-27 ENCOUNTER — Encounter: Payer: Self-pay | Admitting: Internal Medicine

## 2023-09-27 DIAGNOSIS — E611 Iron deficiency: Secondary | ICD-10-CM

## 2023-09-28 DIAGNOSIS — M5451 Vertebrogenic low back pain: Secondary | ICD-10-CM | POA: Diagnosis not present

## 2023-09-28 DIAGNOSIS — M25561 Pain in right knee: Secondary | ICD-10-CM | POA: Diagnosis not present

## 2023-09-28 DIAGNOSIS — M6281 Muscle weakness (generalized): Secondary | ICD-10-CM | POA: Diagnosis not present

## 2023-10-03 DIAGNOSIS — M5451 Vertebrogenic low back pain: Secondary | ICD-10-CM | POA: Diagnosis not present

## 2023-10-03 DIAGNOSIS — M25561 Pain in right knee: Secondary | ICD-10-CM | POA: Diagnosis not present

## 2023-10-03 DIAGNOSIS — E611 Iron deficiency: Secondary | ICD-10-CM | POA: Diagnosis not present

## 2023-10-03 DIAGNOSIS — M6281 Muscle weakness (generalized): Secondary | ICD-10-CM | POA: Diagnosis not present

## 2023-10-04 LAB — CBC WITH DIFFERENTIAL/PLATELET
Basophils Absolute: 0 10*3/uL (ref 0.0–0.2)
Basos: 1 %
EOS (ABSOLUTE): 0.2 10*3/uL (ref 0.0–0.4)
Eos: 5 %
Hematocrit: 44.2 % (ref 37.5–51.0)
Hemoglobin: 14.7 g/dL (ref 13.0–17.7)
Immature Grans (Abs): 0 10*3/uL (ref 0.0–0.1)
Immature Granulocytes: 0 %
Lymphocytes Absolute: 1.7 10*3/uL (ref 0.7–3.1)
Lymphs: 38 %
MCH: 30.5 pg (ref 26.6–33.0)
MCHC: 33.3 g/dL (ref 31.5–35.7)
MCV: 92 fL (ref 79–97)
Monocytes Absolute: 0.5 10*3/uL (ref 0.1–0.9)
Monocytes: 10 %
Neutrophils Absolute: 2 10*3/uL (ref 1.4–7.0)
Neutrophils: 46 %
Platelets: 211 10*3/uL (ref 150–450)
RBC: 4.82 x10E6/uL (ref 4.14–5.80)
RDW: 11.7 % (ref 11.6–15.4)
WBC: 4.4 10*3/uL (ref 3.4–10.8)

## 2023-10-04 LAB — IRON,TIBC AND FERRITIN PANEL
Ferritin: 56 ng/mL (ref 30–400)
Iron Saturation: 15 % (ref 15–55)
Iron: 43 ug/dL (ref 38–169)
Total Iron Binding Capacity: 278 ug/dL (ref 250–450)
UIBC: 235 ug/dL (ref 111–343)

## 2023-10-06 ENCOUNTER — Telehealth: Payer: Self-pay | Admitting: Internal Medicine

## 2023-10-06 DIAGNOSIS — M25561 Pain in right knee: Secondary | ICD-10-CM | POA: Diagnosis not present

## 2023-10-06 DIAGNOSIS — M5451 Vertebrogenic low back pain: Secondary | ICD-10-CM | POA: Diagnosis not present

## 2023-10-06 DIAGNOSIS — M6281 Muscle weakness (generalized): Secondary | ICD-10-CM | POA: Diagnosis not present

## 2023-10-06 NOTE — Telephone Encounter (Signed)
Dr Judithann Graves is out of the office until Feb 5th. She will review this when she returns.  - Ulanda Tackett

## 2023-10-06 NOTE — Telephone Encounter (Signed)
Copied from CRM [id number]. Topic: General - Inquiry >> Oct 06, 2023 10:20 AM Haroldine Laws wrote: Reason for CRM: Alana with [employer] called asking if they could get the request they faxed to the office on 09/29/23 signed and faxed back to them at [phone]  It is scanned in pt's chart not signed.  [phone]

## 2023-10-10 DIAGNOSIS — M25561 Pain in right knee: Secondary | ICD-10-CM | POA: Diagnosis not present

## 2023-10-10 DIAGNOSIS — M6281 Muscle weakness (generalized): Secondary | ICD-10-CM | POA: Diagnosis not present

## 2023-10-10 DIAGNOSIS — M5451 Vertebrogenic low back pain: Secondary | ICD-10-CM | POA: Diagnosis not present

## 2023-10-13 DIAGNOSIS — M6281 Muscle weakness (generalized): Secondary | ICD-10-CM | POA: Diagnosis not present

## 2023-10-13 DIAGNOSIS — M5451 Vertebrogenic low back pain: Secondary | ICD-10-CM | POA: Diagnosis not present

## 2023-10-13 DIAGNOSIS — M25561 Pain in right knee: Secondary | ICD-10-CM | POA: Diagnosis not present

## 2023-10-13 NOTE — Telephone Encounter (Signed)
 Alana called back for status update on request

## 2023-10-13 NOTE — Telephone Encounter (Signed)
 Pt  returning call back from [facility]. Pt states he does not need a referral for the physical therapy and did start physical therapy on his own through insurance.

## 2023-10-16 NOTE — Telephone Encounter (Signed)
 Will fax PT back and let them know we cannot sign because we did not order PT.  [facility]: Fax: [phone] Therapist: Cecelia Coddington, PT

## 2023-10-18 DIAGNOSIS — M6281 Muscle weakness (generalized): Secondary | ICD-10-CM | POA: Diagnosis not present

## 2023-10-18 DIAGNOSIS — M25561 Pain in right knee: Secondary | ICD-10-CM | POA: Diagnosis not present

## 2023-10-18 DIAGNOSIS — M5451 Vertebrogenic low back pain: Secondary | ICD-10-CM | POA: Diagnosis not present

## 2023-10-24 DIAGNOSIS — M5451 Vertebrogenic low back pain: Secondary | ICD-10-CM | POA: Diagnosis not present

## 2023-10-24 DIAGNOSIS — M6281 Muscle weakness (generalized): Secondary | ICD-10-CM | POA: Diagnosis not present

## 2023-10-24 DIAGNOSIS — M25561 Pain in right knee: Secondary | ICD-10-CM | POA: Diagnosis not present

## 2024-03-04 ENCOUNTER — Ambulatory Visit: Admitting: Student

## 2024-07-02 ENCOUNTER — Encounter: Payer: Self-pay | Admitting: Internal Medicine

## 2024-07-02 ENCOUNTER — Ambulatory Visit (INDEPENDENT_AMBULATORY_CARE_PROVIDER_SITE_OTHER): Payer: Self-pay | Admitting: Internal Medicine

## 2024-07-02 VITALS — BP 126/70 | HR 72 | Ht 72.0 in | Wt 196.0 lb

## 2024-07-02 DIAGNOSIS — Z13220 Encounter for screening for lipoid disorders: Secondary | ICD-10-CM

## 2024-07-02 DIAGNOSIS — Z131 Encounter for screening for diabetes mellitus: Secondary | ICD-10-CM | POA: Diagnosis not present

## 2024-07-02 DIAGNOSIS — Z86018 Personal history of other benign neoplasm: Secondary | ICD-10-CM | POA: Diagnosis not present

## 2024-07-02 DIAGNOSIS — Z0000 Encounter for general adult medical examination without abnormal findings: Secondary | ICD-10-CM

## 2024-07-02 DIAGNOSIS — E611 Iron deficiency: Secondary | ICD-10-CM

## 2024-07-02 NOTE — Assessment & Plan Note (Signed)
 Due for flex sig in 2027 No current symptoms noted

## 2024-07-02 NOTE — Assessment & Plan Note (Signed)
 Hx of iron deficiency. Last iron levels normal

## 2024-07-02 NOTE — Progress Notes (Signed)
 Date:  07/02/2024   Name:  Nathaniel Pratt   DOB:  08/01/1991   MRN:  [id number]   Chief Complaint: Annual Exam Nathaniel Pratt is a 33 y.o. male who presents today for his Complete Annual Exam. He feels well. He reports exercising lifting and running 5 days per week. He reports he is sleeping well. Currently caring for his wife after an ankle fracture and surgery.  Health Maintenance  Topic Date Due   HIV Screening  Never done   Hepatitis B Vaccine (1 of 3 - 19+ 3-dose series) Never done   HPV Vaccine (1 - 3-dose SCDM series) Never done   COVID-19 Vaccine (3 - 2025-26 season) 05/06/2024   Flu Shot  12/03/2024*   Colon Cancer Screening  11/02/2025   DTaP/Tdap/Td vaccine (2 - Td or Tdap) 03/04/2033   Hepatitis C Screening  Completed   Pneumococcal Vaccine  Aged Out   Meningitis B Vaccine  Aged Out  *Topic was postponed. The date shown is not the original due date.   No results found for: PSA1, PSA   HPI  Review of Systems  Constitutional:  Negative for fatigue and unexpected weight change.  HENT:  Negative for nosebleeds and trouble swallowing.   Eyes:  Negative for visual disturbance.  Respiratory:  Negative for cough, chest tightness, shortness of breath and wheezing.   Cardiovascular:  Negative for chest pain, palpitations and leg swelling.  Gastrointestinal:  Negative for abdominal pain, constipation and diarrhea.  Genitourinary:  Negative for urgency.  Musculoskeletal:  Negative for arthralgias and joint swelling.  Skin:  Negative for color change and rash.  Allergic/Immunologic: Positive for environmental allergies.  Neurological:  Negative for dizziness, weakness, light-headedness and headaches.  Psychiatric/Behavioral:  Negative for dysphoric mood and sleep disturbance. The patient is not nervous/anxious.      Lab Results  Component Value Date   NA 139 06/27/2023   K 5.0 06/27/2023   CO2 25 06/27/2023   GLUCOSE 92 06/27/2023   BUN 19 06/27/2023    CREATININE 1.06 06/27/2023   CALCIUM 9.6 06/27/2023   EGFR 96 06/27/2023   GFRNONAA >60 03/10/2022   Lab Results  Component Value Date   CHOL 128 06/27/2023   HDL 45 06/27/2023   LDLCALC 68 06/27/2023   TRIG 74 06/27/2023   CHOLHDL 2.8 06/27/2023   No results found for: TSH Lab Results  Component Value Date   HGBA1C 5.2 06/27/2023   Lab Results  Component Value Date   WBC 4.4 10/03/2023   HGB 14.7 10/03/2023   HCT 44.2 10/03/2023   MCV 92 10/03/2023   PLT 211 10/03/2023   Lab Results  Component Value Date   ALT 19 06/27/2023   AST 22 06/27/2023   ALKPHOS 61 06/27/2023   BILITOT 0.4 06/27/2023   No results found for: MARIEN BOLLS, VD25OH   Patient Active Problem List   Diagnosis Date Noted   Adenomatous polyp of colon 11/02/2022   Iron deficiency 04/14/2022   History of benign schwannoma 08/13/2021   S/P laparoscopic-assisted sigmoidectomy 08/13/2021   Genetic testing 06/24/2021   Family history of breast cancer 06/10/2021   Environmental and seasonal allergies 12/09/2020    Allergies  Allergen Reactions   Amoxicillin Hives   Penicillins Hives and Itching    Reaction: 18 years    Past Surgical History:  Procedure Laterality Date   COLON SURGERY  08/2021   Sigmoidectomy to remove benign schwanomma. Includes removal of small section of colon  COLONOSCOPY WITH PROPOFOL  N/A 04/07/2021   Procedure: COLONOSCOPY WITH PROPOFOL ;  Surgeon: Therisa Bi, MD;  Location: [facility] ENDOSCOPY;  Service: Gastroenterology;  Laterality: N/A;   COLONOSCOPY WITH PROPOFOL  N/A 11/02/2022   Procedure: COLONOSCOPY WITH PROPOFOL ;  Surgeon: Therisa Bi, MD;  Location: [facility] ENDOSCOPY;  Service: Endoscopy;  Laterality: N/A;   ESOPHAGOGASTRODUODENOSCOPY (EGD) WITH PROPOFOL  N/A 04/07/2021   Procedure: ESOPHAGOGASTRODUODENOSCOPY (EGD) WITH PROPOFOL ;  Surgeon: Therisa Bi, MD;  Location: [facility] ENDOSCOPY;  Service: Gastroenterology;  Laterality: N/A;   EUS N/A 04/29/2021    Procedure: LOWER ENDOSCOPIC ULTRASOUND (EUS);  Surgeon: Teressa Toribio SQUIBB, MD;  Location: [facility] ENDOSCOPY;  Service: Endoscopy;  Laterality: N/A;   FINE NEEDLE ASPIRATION N/A 04/29/2021   Procedure: FINE NEEDLE ASPIRATION (FNA) LINEAR;  Surgeon: Teressa Toribio SQUIBB, MD;  Location: [facility] ENDOSCOPY;  Service: Endoscopy;  Laterality: N/A;   FLEXIBLE SIGMOIDOSCOPY N/A 04/29/2021   Procedure: FLEXIBLE SIGMOIDOSCOPY;  Surgeon: Teressa Toribio SQUIBB, MD;  Location: [facility] ENDOSCOPY;  Service: Endoscopy;  Laterality: N/A;   FLEXIBLE SIGMOIDOSCOPY N/A 08/13/2021   Procedure: DIAGNOSTIC FLEXIBLE SIGMOIDOSCOPY;  Surgeon: Teresa Lonni HERO, MD;  Location: [facility] ORS;  Service: General;  Laterality: N/A;   FRACTURE SURGERY     GIVENS CAPSULE STUDY N/A 10/12/2021   Procedure: GIVENS CAPSULE STUDY;  Surgeon: Therisa Bi, MD;  Location: [facility] ENDOSCOPY;  Service: Gastroenterology;  Laterality: N/A;   ORIF FINGER / THUMB FRACTURE     RHINOPLASTY  2008   VASECTOMY      Social History   Tobacco Use   Smoking status: Never   Smokeless tobacco: Never  Vaping Use   Vaping status: Never Used  Substance Use Topics   Alcohol use: Not Currently   Drug use: Never     Medication list has been reviewed and updated.  Current Meds  Medication Sig   acetaminophen  (TYLENOL ) 650 MG CR tablet Take 650 mg by mouth every 8 (eight) hours as needed for pain.   cetirizine (ZYRTEC) 10 MG tablet Take 10 mg by mouth daily.   CREATINE PO Take 5 mg by mouth daily.   ferrous sulfate 325 (65 FE) MG tablet Take 325 mg by mouth every other day.   ibuprofen  (ADVIL ) 200 MG tablet Take 200 mg by mouth every 6 (six) hours as needed.       07/02/2024    8:27 AM 06/27/2023    9:31 AM 03/06/2023    2:19 PM 06/21/2022   10:56 AM  GAD 7 : Generalized Anxiety Score  Nervous, Anxious, on Edge 0 0 1 0  Control/stop worrying 0 0 1 0  Worry too much - different things 0 0 1 0  Trouble relaxing 0 0 1 0  Restless 0 0 0 0  Easily annoyed or irritable 0  1 2 0  Afraid - awful might happen 0 0 0 0  Total GAD 7 Score 0 1 6 0  Anxiety Difficulty Not difficult at all Not difficult at all Somewhat difficult Not difficult at all       07/02/2024    8:27 AM 06/27/2023    9:31 AM 03/06/2023    2:19 PM  Depression screen PHQ 2/9  Decreased Interest 0 0 1  Down, Depressed, Hopeless 0 0 1  PHQ - 2 Score 0 0 2  Altered sleeping 0 1 0  Tired, decreased energy 0 1 0  Change in appetite 0 0 0  Feeling bad or failure about yourself  0 0 1  Trouble concentrating 0 0 0  Moving slowly or fidgety/restless 0 0 0  Suicidal thoughts 0 0 0  PHQ-9 Score 0 2 3  Difficult doing work/chores Not difficult at all Not difficult at all Not difficult at all    BP Readings from Last 3 Encounters:  07/02/24 126/70  06/27/23 124/70  03/06/23 124/60    Physical Exam Vitals and nursing note reviewed.  Constitutional:      Appearance: Normal appearance. He is well-developed.  HENT:     Head: Normocephalic.     Right Ear: Tympanic membrane, ear canal and external ear normal.     Left Ear: Tympanic membrane, ear canal and external ear normal.     Nose: Nose normal.  Eyes:     Conjunctiva/sclera: Conjunctivae normal.     Pupils: Pupils are equal, round, and reactive to light.  Neck:     Thyroid: No thyromegaly.     Vascular: No carotid bruit.  Cardiovascular:     Rate and Rhythm: Normal rate and regular rhythm.     Heart sounds: Normal heart sounds.  Pulmonary:     Effort: Pulmonary effort is normal.     Breath sounds: Normal breath sounds. No wheezing.  Chest:  Breasts:    Right: No mass.     Left: No mass.  Abdominal:     General: Bowel sounds are normal.     Palpations: Abdomen is soft.     Tenderness: There is no abdominal tenderness.  Musculoskeletal:        General: Normal range of motion.     Cervical back: Normal range of motion and neck supple.     Right lower leg: No edema.     Left lower leg: No edema.  Lymphadenopathy:      Cervical: No cervical adenopathy.  Skin:    General: Skin is warm and dry.     Capillary Refill: Capillary refill takes less than 2 seconds.  Neurological:     General: No focal deficit present.     Mental Status: He is alert and oriented to person, place, and time.     Deep Tendon Reflexes: Reflexes are normal and symmetric.  Psychiatric:        Attention and Perception: Attention normal.        Mood and Affect: Mood normal.        Thought Content: Thought content normal.     Wt Readings from Last 3 Encounters:  07/02/24 196 lb (88.9 kg)  06/27/23 200 lb (90.7 kg)  03/06/23 202 lb (91.6 kg)    BP 126/70   Pulse 72   Ht 6' (1.829 m)   Wt 196 lb (88.9 kg)   SpO2 97%   BMI 26.58 kg/m   Assessment and Plan:  Problem List Items Addressed This Visit       Unprioritized   History of benign schwannoma   Due for flex sig in 2027 No current symptoms noted      Iron deficiency (Chronic)   Hx of iron deficiency. Last iron levels normal      Relevant Orders   CBC with Differential/Platelet   Iron, TIBC and Ferritin Panel   Other Visit Diagnoses       Annual physical exam    -  Primary   recommend Flu vaccine - he will get at [facility] continue healthy diet and regular exercise   Relevant Orders   CBC with Differential/Platelet   Comprehensive metabolic panel with GFR   Lipid panel   Iron, TIBC  and Ferritin Panel   Hemoglobin A1c     Screening for diabetes mellitus       Relevant Orders   Comprehensive metabolic panel with GFR   Hemoglobin A1c     Screening for lipid disorders       Relevant Orders   Lipid panel      He plans to transfer PCP to a [facility] practice closer to home. No follow-ups on file.    Leita HILARIO Adie, MD [facility] [place]

## 2024-07-03 ENCOUNTER — Ambulatory Visit: Payer: Self-pay | Admitting: Internal Medicine

## 2024-07-03 LAB — COMPREHENSIVE METABOLIC PANEL WITH GFR
ALT: 15 IU/L (ref 0–44)
AST: 21 IU/L (ref 0–40)
Albumin: 4.4 g/dL (ref 4.1–5.1)
Alkaline Phosphatase: 65 IU/L (ref 47–123)
BUN/Creatinine Ratio: 20 (ref 9–20)
BUN: 22 mg/dL — ABNORMAL HIGH (ref 6–20)
Bilirubin Total: 0.4 mg/dL (ref 0.0–1.2)
CO2: 25 mmol/L (ref 20–29)
Calcium: 9.3 mg/dL (ref 8.7–10.2)
Chloride: 103 mmol/L (ref 96–106)
Creatinine, Ser: 1.11 mg/dL (ref 0.76–1.27)
Globulin, Total: 2.5 g/dL (ref 1.5–4.5)
Glucose: 72 mg/dL (ref 70–99)
Potassium: 4.9 mmol/L (ref 3.5–5.2)
Sodium: 140 mmol/L (ref 134–144)
Total Protein: 6.9 g/dL (ref 6.0–8.5)
eGFR: 90 mL/min/1.73 (ref >59)

## 2024-07-03 LAB — CBC WITH DIFFERENTIAL/PLATELET
Basophils Absolute: 0 x10E3/uL (ref 0.0–0.2)
Basos: 1 %
EOS (ABSOLUTE): 0.2 x10E3/uL (ref 0.0–0.4)
Eos: 5 %
Hematocrit: 45.4 % (ref 37.5–51.0)
Hemoglobin: 15 g/dL (ref 13.0–17.7)
Immature Grans (Abs): 0 x10E3/uL (ref 0.0–0.1)
Immature Granulocytes: 0 %
Lymphocytes Absolute: 1.6 x10E3/uL (ref 0.7–3.1)
Lymphs: 35 %
MCH: 31 pg (ref 26.6–33.0)
MCHC: 33 g/dL (ref 31.5–35.7)
MCV: 94 fL (ref 79–97)
Monocytes Absolute: 0.4 x10E3/uL (ref 0.1–0.9)
Monocytes: 9 %
Neutrophils Absolute: 2.3 x10E3/uL (ref 1.4–7.0)
Neutrophils: 50 %
Platelets: 197 x10E3/uL (ref 150–450)
RBC: 4.84 x10E6/uL (ref 4.14–5.80)
RDW: 11.8 % (ref 11.6–15.4)
WBC: 4.6 x10E3/uL (ref 3.4–10.8)

## 2024-07-03 LAB — LIPID PANEL
Chol/HDL Ratio: 2.8 ratio (ref 0.0–5.0)
Cholesterol, Total: 128 mg/dL (ref 100–199)
HDL: 45 mg/dL (ref >39)
LDL Chol Calc (NIH): 65 mg/dL (ref 0–99)
Triglycerides: 94 mg/dL (ref 0–149)
VLDL Cholesterol Cal: 18 mg/dL (ref 5–40)

## 2024-07-03 LAB — IRON,TIBC AND FERRITIN PANEL
Ferritin: 56 ng/mL (ref 30–400)
Iron Saturation: 34 % (ref 15–55)
Iron: 99 ug/dL (ref 38–169)
Total Iron Binding Capacity: 287 ug/dL (ref 250–450)
UIBC: 188 ug/dL (ref 111–343)

## 2024-07-03 LAB — HEMOGLOBIN A1C
Est. average glucose Bld gHb Est-mCnc: 105 mg/dL
Hgb A1c MFr Bld: 5.3 % (ref 4.8–5.6)

## 2024-08-02 ENCOUNTER — Ambulatory Visit
